# Patient Record
Sex: Female | Born: 1970 | Race: Black or African American | Hispanic: No | Marital: Married | Smoking: Never smoker
Health system: Southern US, Community
[De-identification: ages and names within clinical notes are randomized; demographics above are authoritative.]

## PROBLEM LIST (undated history)

## (undated) ENCOUNTER — Inpatient Hospital Stay (HOSPITAL_COMMUNITY): Payer: Self-pay

## (undated) DIAGNOSIS — K219 Gastro-esophageal reflux disease without esophagitis: Secondary | ICD-10-CM

## (undated) DIAGNOSIS — Z789 Other specified health status: Secondary | ICD-10-CM

## (undated) DIAGNOSIS — I10 Essential (primary) hypertension: Secondary | ICD-10-CM

## (undated) HISTORY — PX: TUBAL LIGATION: SHX77

## (undated) HISTORY — DX: Gastro-esophageal reflux disease without esophagitis: K21.9

## (undated) HISTORY — DX: Essential (primary) hypertension: I10

## (undated) HISTORY — PX: NO PAST SURGERIES: SHX2092

---

## 1999-10-03 ENCOUNTER — Other Ambulatory Visit: Admission: RE | Admit: 1999-10-03 | Discharge: 1999-10-03 | Payer: Self-pay | Admitting: Obstetrics

## 2001-05-14 ENCOUNTER — Other Ambulatory Visit: Admission: RE | Admit: 2001-05-14 | Discharge: 2001-05-14 | Payer: Self-pay | Admitting: Gynecology

## 2002-12-31 ENCOUNTER — Emergency Department (HOSPITAL_COMMUNITY): Admission: EM | Admit: 2002-12-31 | Discharge: 2002-12-31 | Payer: Self-pay | Admitting: Emergency Medicine

## 2006-01-01 ENCOUNTER — Other Ambulatory Visit: Admission: RE | Admit: 2006-01-01 | Discharge: 2006-01-01 | Payer: Self-pay | Admitting: Obstetrics and Gynecology

## 2014-08-12 ENCOUNTER — Other Ambulatory Visit: Payer: Self-pay

## 2014-09-10 ENCOUNTER — Other Ambulatory Visit (HOSPITAL_COMMUNITY): Payer: Self-pay | Admitting: Obstetrics and Gynecology

## 2014-09-10 DIAGNOSIS — IMO0002 Reserved for concepts with insufficient information to code with codable children: Secondary | ICD-10-CM

## 2014-09-10 DIAGNOSIS — Z0489 Encounter for examination and observation for other specified reasons: Secondary | ICD-10-CM

## 2014-09-16 ENCOUNTER — Ambulatory Visit (HOSPITAL_COMMUNITY)
Admission: RE | Admit: 2014-09-16 | Discharge: 2014-09-16 | Disposition: A | Discharge status: Home / Self Care | Payer: Federal, State, Local not specified - PPO | Source: Ambulatory Visit | Attending: Obstetrics and Gynecology | Admitting: Obstetrics and Gynecology

## 2014-09-16 ENCOUNTER — Encounter (HOSPITAL_COMMUNITY): Payer: Self-pay

## 2014-09-16 VITALS — BP 130/67 | HR 101 | Wt 242.2 lb

## 2014-09-16 DIAGNOSIS — Z1389 Encounter for screening for other disorder: Secondary | ICD-10-CM | POA: Diagnosis not present

## 2014-09-16 DIAGNOSIS — O09292 Supervision of pregnancy with other poor reproductive or obstetric history, second trimester: Secondary | ICD-10-CM

## 2014-09-16 DIAGNOSIS — Z363 Encounter for antenatal screening for malformations: Secondary | ICD-10-CM | POA: Insufficient documentation

## 2014-09-16 DIAGNOSIS — Z0489 Encounter for examination and observation for other specified reasons: Secondary | ICD-10-CM

## 2014-09-16 DIAGNOSIS — IMO0002 Reserved for concepts with insufficient information to code with codable children: Secondary | ICD-10-CM

## 2014-09-16 DIAGNOSIS — O09299 Supervision of pregnancy with other poor reproductive or obstetric history, unspecified trimester: Secondary | ICD-10-CM | POA: Insufficient documentation

## 2014-10-01 ENCOUNTER — Other Ambulatory Visit (HOSPITAL_COMMUNITY): Payer: Self-pay | Admitting: Obstetrics and Gynecology

## 2014-10-01 DIAGNOSIS — O99212 Obesity complicating pregnancy, second trimester: Secondary | ICD-10-CM

## 2014-10-01 DIAGNOSIS — O09512 Supervision of elderly primigravida, second trimester: Secondary | ICD-10-CM

## 2014-10-01 DIAGNOSIS — E282 Polycystic ovarian syndrome: Secondary | ICD-10-CM

## 2014-10-07 ENCOUNTER — Other Ambulatory Visit (HOSPITAL_COMMUNITY): Payer: Self-pay | Admitting: Obstetrics and Gynecology

## 2014-10-07 ENCOUNTER — Encounter (HOSPITAL_COMMUNITY): Payer: Self-pay

## 2014-10-07 ENCOUNTER — Inpatient Hospital Stay (HOSPITAL_COMMUNITY)
Admission: AD | Admit: 2014-10-07 | Discharge: 2014-10-13 | DRG: 782 | Disposition: A | Discharge status: Home / Self Care | Payer: Federal, State, Local not specified - PPO | Source: Ambulatory Visit | Attending: Obstetrics and Gynecology | Admitting: Obstetrics and Gynecology

## 2014-10-07 ENCOUNTER — Ambulatory Visit (HOSPITAL_COMMUNITY)
Admission: RE | Admit: 2014-10-07 | Discharge: 2014-10-07 | Disposition: A | Discharge status: Home / Self Care | Payer: Federal, State, Local not specified - PPO | Source: Ambulatory Visit | Attending: Obstetrics and Gynecology | Admitting: Obstetrics and Gynecology

## 2014-10-07 ENCOUNTER — Encounter (HOSPITAL_COMMUNITY): Payer: Self-pay | Admitting: *Deleted

## 2014-10-07 VITALS — BP 141/84 | HR 91 | Wt 248.0 lb

## 2014-10-07 DIAGNOSIS — O4102X0 Oligohydramnios, second trimester, not applicable or unspecified: Secondary | ICD-10-CM | POA: Diagnosis present

## 2014-10-07 DIAGNOSIS — O139 Gestational [pregnancy-induced] hypertension without significant proteinuria, unspecified trimester: Secondary | ICD-10-CM | POA: Diagnosis present

## 2014-10-07 DIAGNOSIS — O99212 Obesity complicating pregnancy, second trimester: Secondary | ICD-10-CM

## 2014-10-07 DIAGNOSIS — E282 Polycystic ovarian syndrome: Secondary | ICD-10-CM

## 2014-10-07 DIAGNOSIS — Z8249 Family history of ischemic heart disease and other diseases of the circulatory system: Secondary | ICD-10-CM

## 2014-10-07 DIAGNOSIS — IMO0002 Reserved for concepts with insufficient information to code with codable children: Secondary | ICD-10-CM

## 2014-10-07 DIAGNOSIS — O09512 Supervision of elderly primigravida, second trimester: Secondary | ICD-10-CM

## 2014-10-07 DIAGNOSIS — Z3A23 23 weeks gestation of pregnancy: Secondary | ICD-10-CM | POA: Diagnosis present

## 2014-10-07 DIAGNOSIS — Z3A24 24 weeks gestation of pregnancy: Secondary | ICD-10-CM

## 2014-10-07 DIAGNOSIS — O365920 Maternal care for other known or suspected poor fetal growth, second trimester, not applicable or unspecified: Secondary | ICD-10-CM | POA: Diagnosis present

## 2014-10-07 DIAGNOSIS — O132 Gestational [pregnancy-induced] hypertension without significant proteinuria, second trimester: Principal | ICD-10-CM | POA: Diagnosis present

## 2014-10-07 HISTORY — DX: Supervision of elderly primigravida, second trimester: O09.512

## 2014-10-07 HISTORY — DX: Other specified health status: Z78.9

## 2014-10-07 HISTORY — DX: Reserved for concepts with insufficient information to code with codable children: IMO0002

## 2014-10-07 HISTORY — DX: Gestational (pregnancy-induced) hypertension without significant proteinuria, unspecified trimester: O13.9

## 2014-10-07 LAB — URINALYSIS, ROUTINE W REFLEX MICROSCOPIC
Bilirubin Urine: NEGATIVE
Glucose, UA: NEGATIVE mg/dL
Hgb urine dipstick: NEGATIVE
Ketones, ur: NEGATIVE mg/dL
Leukocytes, UA: NEGATIVE
NITRITE: NEGATIVE
PH: 6 (ref 5.0–8.0)
Protein, ur: NEGATIVE mg/dL
Urobilinogen, UA: 0.2 mg/dL (ref 0.0–1.0)

## 2014-10-07 LAB — COMPREHENSIVE METABOLIC PANEL
ALT: 106 U/L — AB (ref 0–35)
AST: 47 U/L — AB (ref 0–37)
Albumin: 3.3 g/dL — ABNORMAL LOW (ref 3.5–5.2)
Alkaline Phosphatase: 70 U/L (ref 39–117)
Anion gap: 12 (ref 5–15)
BUN: 8 mg/dL (ref 6–23)
CALCIUM: 9.3 mg/dL (ref 8.4–10.5)
CO2: 20 mEq/L (ref 19–32)
Chloride: 104 mEq/L (ref 96–112)
Creatinine, Ser: 0.66 mg/dL (ref 0.50–1.10)
GFR calc Af Amer: >90 mL/min (ref >90)
GFR calc non Af Amer: >90 mL/min (ref >90)
Glucose, Bld: 91 mg/dL (ref 70–99)
Potassium: 4.6 mEq/L (ref 3.7–5.3)
SODIUM: 136 meq/L — AB (ref 137–147)
TOTAL PROTEIN: 6.7 g/dL (ref 6.0–8.3)
Total Bilirubin: 0.2 mg/dL — ABNORMAL LOW (ref 0.3–1.2)

## 2014-10-07 LAB — CBC
HCT: 39.3 % (ref 36.0–46.0)
Hemoglobin: 13.2 g/dL (ref 12.0–15.0)
MCH: 26.9 pg (ref 26.0–34.0)
MCHC: 33.6 g/dL (ref 30.0–36.0)
MCV: 80 fL (ref 78.0–100.0)
PLATELETS: 261 10*3/uL (ref 150–400)
RBC: 4.91 MIL/uL (ref 3.87–5.11)
RDW: 16 % — ABNORMAL HIGH (ref 11.5–15.5)
WBC: 7.5 10*3/uL (ref 4.0–10.5)

## 2014-10-07 LAB — TYPE AND SCREEN
ABO/RH(D): O POS
Antibody Screen: NEGATIVE

## 2014-10-07 LAB — URIC ACID: URIC ACID, SERUM: 4.7 mg/dL (ref 2.4–7.0)

## 2014-10-07 LAB — LACTATE DEHYDROGENASE: LDH: 209 U/L (ref 94–250)

## 2014-10-07 MED ORDER — PRENATAL MULTIVITAMIN CH
1.0000 | ORAL_TABLET | Freq: Every day | ORAL | Status: DC
  Administered 2014-10-08 – 2014-10-13 (×6): 1 via ORAL
  Filled 2014-10-07 (×8): qty 1

## 2014-10-07 MED ORDER — BETAMETHASONE SOD PHOS & ACET 6 (3-3) MG/ML IJ SUSP
12.0000 mg | INTRAMUSCULAR | Status: AC
  Administered 2014-10-07 – 2014-10-08 (×2): 12 mg via INTRAMUSCULAR
  Filled 2014-10-07 (×2): qty 2

## 2014-10-07 MED ORDER — SODIUM CHLORIDE 0.9 % IJ SOLN
3.0000 mL | Freq: Two times a day (BID) | INTRAMUSCULAR | Status: DC
  Administered 2014-10-07 – 2014-10-10 (×7): 3 mL via INTRAVENOUS

## 2014-10-07 MED ORDER — ACETAMINOPHEN 325 MG PO TABS: 650.0000 mg | ORAL_TABLET | ORAL | Status: DC | PRN

## 2014-10-07 MED ORDER — PRENATAL MULTIVITAMIN CH: 1.0000 | ORAL_TABLET | Freq: Every day | ORAL | Status: DC

## 2014-10-07 MED ORDER — CALCIUM CARBONATE ANTACID 500 MG PO CHEW
2.0000 | CHEWABLE_TABLET | ORAL | Status: DC | PRN
Start: 2014-10-07 — End: 2014-10-13
  Filled 2014-10-07: qty 2

## 2014-10-07 MED ORDER — DOCUSATE SODIUM 100 MG PO CAPS
100.0000 mg | ORAL_CAPSULE | Freq: Every day | ORAL | Status: DC
  Administered 2014-10-08 – 2014-10-13 (×6): 100 mg via ORAL
  Filled 2014-10-07 (×8): qty 1

## 2014-10-07 MED ORDER — ZOLPIDEM TARTRATE 5 MG PO TABS
5.0000 mg | ORAL_TABLET | Freq: Every evening | ORAL | Status: DC | PRN
Start: 2014-10-07 — End: 2014-10-13

## 2014-10-07 NOTE — MAU Note (Signed)
Pt sent from MFM for elevated B/ps. And low fluid.

## 2014-10-07 NOTE — MAU Provider Note (Signed)
History     CSN: [id number]  Arrival date and time: 10/07/14 1238   None     Chief Complaint  Patient presents with  . Hypertension   HPI Pt is 8082w2d pregnant sent from MFM with elevated BP and low fluid. Pt denies abd pain,nausea,  UTI sx or headache. RN note: Andrey CampanileWilson, RN Registered Nurse Signed  MAU Note Service date: 10/07/2014 12:58 PM   Pt sent from MFM for elevated B/ps. And low fluid      Past Medical History  Diagnosis Date  . Medical history non-contributory     Past Surgical History  Procedure Laterality Date  . No past surgeries      No family history on file.  History  Substance Use Topics  . Smoking status: Never Smoker   . Smokeless tobacco: Not on file  . Alcohol Use: No    Allergies: No Known Allergies  Prescriptions prior to admission  Medication Sig Dispense Refill  . Acetaminophen (TYLENOL PO) Take by mouth.      . Prenatal Vit-Fe Fumarate-FA (PRENATAL VITAMIN PO) Take by mouth.        Review of Systems  Constitutional: Negative for fever and chills.  Gastrointestinal: Negative for nausea, vomiting, abdominal pain and diarrhea.  Genitourinary: Negative for dysuria.   Physical Exam   There were no vitals taken for this visit.  Physical Exam  Nursing note and vitals reviewed. Constitutional: She is oriented to person, place, and time. She appears well-developed and well-nourished. No distress.  HENT:  Head: Normocephalic.  Eyes: Pupils are equal, round, and reactive to light.  Neck: Normal range of motion.  Cardiovascular: Normal rate.   Respiratory: Effort normal.  GI: Soft. She exhibits no distension. There is no tenderness.  FHR 160 baseline No ctx noted  Musculoskeletal: Normal range of motion.  Neurological: She is alert and oriented to person, place, and time.  Skin: Skin is warm and dry.    MAU Course  Procedures Results for orders placed during the hospital encounter of 10/07/14 (from the past 24 hour(s))   URINALYSIS, ROUTINE W REFLEX MICROSCOPIC     Status: Abnormal   Collection Time    10/07/14 12:55 PM      Result Value Ref Range   Color, Urine YELLOW  YELLOW   APPearance CLEAR  CLEAR   Specific Gravity, Urine <1.005 (*) 1.005 - 1.030   pH 6.0  5.0 - 8.0   Glucose, UA NEGATIVE  NEGATIVE mg/dL   Hgb urine dipstick NEGATIVE  NEGATIVE   Bilirubin Urine NEGATIVE  NEGATIVE   Ketones, ur NEGATIVE  NEGATIVE mg/dL   Protein, ur NEGATIVE  NEGATIVE mg/dL   Urobilinogen, UA 0.2  0.0 - 1.0 mg/dL   Nitrite NEGATIVE  NEGATIVE   Leukocytes, UA NEGATIVE  NEGATIVE  CBC     Status: Abnormal   Collection Time    10/07/14  1:03 PM      Result Value Ref Range   WBC 7.5  4.0 - 10.5 K/uL   RBC 4.91  3.87 - 5.11 MIL/uL   Hemoglobin 13.2  12.0 - 15.0 g/dL   HCT 32.439.3  40.136.0 - 02.746.0 %   MCV 80.0  78.0 - 100.0 fL   MCH 26.9  26.0 - 34.0 pg   MCHC 33.6  30.0 - 36.0 g/dL   RDW 25.316.0 (*) 66.411.5 - 40.315.5 %   Platelets 261  150 - 400 K/uL  COMPREHENSIVE METABOLIC PANEL     Status: Abnormal  Collection Time    10/07/14  1:03 PM      Result Value Ref Range   Sodium 136 (*) 137 - 147 mEq/L   Potassium 4.6  3.7 - 5.3 mEq/L   Chloride 104  96 - 112 mEq/L   CO2 20  19 - 32 mEq/L   Glucose, Bld 91  70 - 99 mg/dL   BUN 8  6 - 23 mg/dL   Creatinine, Ser 1.47  0.50 - 1.10 mg/dL   Calcium 9.3  8.4 - 82.9 mg/dL   Total Protein 6.7  6.0 - 8.3 g/dL   Albumin 3.3 (*) 3.5 - 5.2 g/dL   AST 47 (*) 0 - 37 U/L   ALT 106 (*) 0 - 35 U/L   Alkaline Phosphatase 70  39 - 117 U/L   Total Bilirubin 0.2 (*) 0.3 - 1.2 mg/dL   GFR calc non Af Amer >90  >90 mL/min   GFR calc Af Amer >90  >90 mL/min   Anion gap 12  5 - 15  LACTATE DEHYDROGENASE     Status: None   Collection Time    10/07/14  1:03 PM      Result Value Ref Range   LDH 209  94 - 250 U/L  URIC ACID     Status: None   Collection Time    10/07/14  1:03 PM      Result Value Ref Range   Uric Acid, Serum 4.7  2.4 - 7.0 mg/dL  discussed with Dr. Arelia Sneddon- will admit  due to elevated LFT Discussed with pt and husband  Assessment and Plan  Hypertension in pregnancy- will admit to antenatal per Dr. Gabriel Cirri 10/07/2014, 12:59 PM

## 2014-10-07 NOTE — H&P (Signed)
Zenaida DeedLakiesha Fleek is a 44 y.o. female presenting 23.3 weeks with gestational hypertension.  Seen by MFM today for sonogram.  Blood pressure noted to be elevated.  Sonogram with subjectely low amniotic fluid.  Estimated fetal weight 17%tile and abdominal circumference 3%tile.  In MAU LFT'S were elevated admitted for evaluation.  Maternal Medical History:  Reason for admission: Gestational hypertension with elevated LFT'S  Fetal activity: Perceived fetal activity is normal.    Prenatal complications: IUGR, oligohydramnios and pre-eclampsia.     OB History   Grav Para Term Preterm Abortions TAB SAB Ect Mult Living   1              Past Medical History  Diagnosis Date  . Medical history non-contributory    Past Surgical History  Procedure Laterality Date  . No past surgeries     Family History: family history is not on file. Social History:  reports that she has never smoked. She does not have any smokeless tobacco history on file. She reports that she does not drink alcohol or use illicit drugs.   Prenatal Transfer Tool  Maternal Diabetes: not tested yet Genetic Screening: Normal Maternal Ultrasounds/Referrals: Abnormal:  Findings:   IUGR Fetal Ultrasounds or other Referrals:  None Maternal Substance Abuse:  No Significant Maternal Medications:  None Significant Maternal Lab Results:  None Other Comments:  None  ROS    Blood pressure 144/84, pulse 82. Exam Physical Exam  Constitutional: She is oriented to person, place, and time. She appears well-developed and well-nourished.  HENT:  Head: Normocephalic.  Eyes: Conjunctivae and EOM are normal. Pupils are equal, round, and reactive to light.  Cardiovascular: Normal rate, regular rhythm and normal heart sounds.   Respiratory: Effort normal and breath sounds normal.  GI:  GRAVID UTERUS  Musculoskeletal: She exhibits edema.  Neurological: She is alert and oriented to person, place, and time. She has normal reflexes.     Prenatal labs: ABO, Rh:   Antibody:   Rubella:   RPR:    HBsAg:    HIV:    GBS:     Assessment/Plan: IUP at 23.3 weeks with gestational hypertension with IUGR and elevated LFT'S ADMITT. Bedrest.   Steriods. Repeat labs in the am   [facility] CenterMCCOMB,Rylend Pietrzak S 10/07/2014, 2:37 PM

## 2014-10-08 ENCOUNTER — Inpatient Hospital Stay (HOSPITAL_COMMUNITY): Payer: Federal, State, Local not specified - PPO

## 2014-10-08 LAB — COMPREHENSIVE METABOLIC PANEL WITH GFR
ALT: 87 U/L — ABNORMAL HIGH (ref 0–35)
AST: 35 U/L (ref 0–37)
Albumin: 3 g/dL — ABNORMAL LOW (ref 3.5–5.2)
Alkaline Phosphatase: 61 U/L (ref 39–117)
Anion gap: 10 (ref 5–15)
BUN: 9 mg/dL (ref 6–23)
CO2: 20 meq/L (ref 19–32)
Calcium: 8.7 mg/dL (ref 8.4–10.5)
Chloride: 104 meq/L (ref 96–112)
Creatinine, Ser: 0.59 mg/dL (ref 0.50–1.10)
GFR calc Af Amer: >90 mL/min
GFR calc non Af Amer: >90 mL/min
Glucose, Bld: 123 mg/dL — ABNORMAL HIGH (ref 70–99)
Potassium: 4.5 meq/L (ref 3.7–5.3)
Sodium: 134 meq/L — ABNORMAL LOW (ref 137–147)
Total Bilirubin: <0.2 mg/dL — ABNORMAL LOW (ref 0.3–1.2)
Total Protein: 6.6 g/dL (ref 6.0–8.3)

## 2014-10-08 LAB — CBC
HCT: 37.7 % (ref 36.0–46.0)
Hemoglobin: 12.7 g/dL (ref 12.0–15.0)
MCH: 26.9 pg (ref 26.0–34.0)
MCHC: 33.7 g/dL (ref 30.0–36.0)
MCV: 79.9 fL (ref 78.0–100.0)
Platelets: 253 10*3/uL (ref 150–400)
RBC: 4.72 MIL/uL (ref 3.87–5.11)
RDW: 15.7 % — ABNORMAL HIGH (ref 11.5–15.5)
WBC: 13 10*3/uL — ABNORMAL HIGH (ref 4.0–10.5)

## 2014-10-08 LAB — ABO/RH: ABO/RH(D): O POS

## 2014-10-08 MED ORDER — LABETALOL HCL 100 MG PO TABS
200.0000 mg | ORAL_TABLET | Freq: Two times a day (BID) | ORAL | Status: DC
  Administered 2014-10-08 – 2014-10-13 (×11): 200 mg via ORAL
  Filled 2014-10-08 (×12): qty 2

## 2014-10-08 NOTE — Consult Note (Signed)
Maternal Fetal Medicine Consultation  Requesting Provider(s): Marylynn Pearson, MD  Reason for consultation: CHTN, elevated ALT, early fetal growth restriction  HPI: Bethany Rojas is a 44 year-old G1P0 currently at 23w 3d admitted due to chronic hypertension, elevated ALT and fetal growth restriction.  The patient was seen for ultrasound by MFM yesterday - noted to have an estimated fetal weight at the 17th %tile and a lagging AC at the 4th %tile.  The amniotic fluid volume was subjectively decreased but with a normal maximum vertical pocket.  At the time of her evaluation, she was noted to have some elevated blood pressures and was sent to MAU for further evaluation.  Her preeclampsia labs were within normal limits with the exception of an ALT of 106.  She is currently admitted for a course of betamethasone and 24-hr urine collection.  The patient denies a history of chronic hypertension prior to pregnancy.  She is without complaints - denies HA, visual changes or RUQ pain.  The fetus is active.  OB History: OB History   Grav Para Term Preterm Abortions TAB SAB Ect Mult Living   1               PMH:  Past Medical History  Diagnosis Date  . Medical history non-contributory     PSH:  Past Surgical History  Procedure Laterality Date  . No past surgeries      Meds:  Scheduled Meds: . betamethasone acetate-betamethasone sodium phosphate  12 mg Intramuscular Q24H  . docusate sodium  100 mg Oral Daily  . prenatal multivitamin  1 tablet Oral Q1200  . sodium chloride  3 mL Intravenous Q12H   Continuous Infusions:  PRN Meds:.acetaminophen, calcium carbonate, zolpidem Scheduled Meds:  Allergies: No Known Allergies  FH:  Family History  Problem Relation Age of Onset  . Cancer Mother   . Hypertension Mother    Soc:  History   Social History  . Marital Status: Married    Spouse Name: N/A    Number of Children: N/A  . Years of Education: N/A   Occupational History  . Not on  file.   Social History Main Topics  . Smoking status: Never Smoker   . Smokeless tobacco: Not on file  . Alcohol Use: No  . Drug Use: No  . Sexual Activity: Yes    Birth Control/ Protection: Surgical   Other Topics Concern  . Not on file   Social History Narrative  . No narrative on file    Review of Systems: no vaginal bleeding or cramping/contractions, no LOF, no nausea/vomiting. All other systems reviewed and are negative.  PE:   Filed Vitals:   10/08/14 0800  BP: 130/95  Pulse: 85  Temp: 98 F (36.7 C)  Resp: 18  BPs: 130/95, 111/52, 150/88, 148/85, 156/108   Labs:  Recent Results (from the past 2160 hour(s))  URINALYSIS, ROUTINE W REFLEX MICROSCOPIC     Status: Abnormal   Collection Time    10/07/14 12:55 PM      Result Value Ref Range   Color, Urine YELLOW  YELLOW   APPearance CLEAR  CLEAR   Specific Gravity, Urine <1.005 (*) 1.005 - 1.030   pH 6.0  5.0 - 8.0   Glucose, UA NEGATIVE  NEGATIVE mg/dL   Hgb urine dipstick NEGATIVE  NEGATIVE   Bilirubin Urine NEGATIVE  NEGATIVE   Ketones, ur NEGATIVE  NEGATIVE mg/dL   Protein, ur NEGATIVE  NEGATIVE mg/dL   Urobilinogen, UA 0.2  0.0 - 1.0 mg/dL   Nitrite NEGATIVE  NEGATIVE   Leukocytes, UA NEGATIVE  NEGATIVE   Comment: MICROSCOPIC NOT DONE ON URINES WITH NEGATIVE PROTEIN, BLOOD, LEUKOCYTES, NITRITE, OR GLUCOSE <1000 mg/dL.  CBC     Status: Abnormal   Collection Time    10/07/14  1:03 PM      Result Value Ref Range   WBC 7.5  4.0 - 10.5 K/uL   RBC 4.91  3.87 - 5.11 MIL/uL   Hemoglobin 13.2  12.0 - 15.0 g/dL   HCT 39.3  36.0 - 46.0 %   MCV 80.0  78.0 - 100.0 fL   MCH 26.9  26.0 - 34.0 pg   MCHC 33.6  30.0 - 36.0 g/dL   RDW 16.0 (*) 11.5 - 15.5 %   Platelets 261  150 - 400 K/uL  COMPREHENSIVE METABOLIC PANEL     Status: Abnormal   Collection Time    10/07/14  1:03 PM      Result Value Ref Range   Sodium 136 (*) 137 - 147 mEq/L   Potassium 4.6  3.7 - 5.3 mEq/L   Chloride 104  96 - 112 mEq/L   CO2 20   19 - 32 mEq/L   Glucose, Bld 91  70 - 99 mg/dL   BUN 8  6 - 23 mg/dL   Creatinine, Ser 0.66  0.50 - 1.10 mg/dL   Calcium 9.3  8.4 - 10.5 mg/dL   Total Protein 6.7  6.0 - 8.3 g/dL   Albumin 3.3 (*) 3.5 - 5.2 g/dL   AST 47 (*) 0 - 37 U/L   ALT 106 (*) 0 - 35 U/L   Alkaline Phosphatase 70  39 - 117 U/L   Total Bilirubin 0.2 (*) 0.3 - 1.2 mg/dL   GFR calc non Af Amer >90  >90 mL/min   GFR calc Af Amer >90  >90 mL/min   Comment: (NOTE)     The eGFR has been calculated using the CKD EPI equation.     This calculation has not been validated in all clinical situations.     eGFR's persistently <90 mL/min signify possible Chronic Kidney     Disease.   Anion gap 12  5 - 15  LACTATE DEHYDROGENASE     Status: None   Collection Time    10/07/14  1:03 PM      Result Value Ref Range   LDH 209  94 - 250 U/L  URIC ACID     Status: None   Collection Time    10/07/14  1:03 PM      Result Value Ref Range   Uric Acid, Serum 4.7  2.4 - 7.0 mg/dL  TYPE AND SCREEN     Status: None   Collection Time    10/07/14  8:55 PM      Result Value Ref Range   ABO/RH(D) O POS     Antibody Screen NEG     Sample Expiration 10/10/2014    ABO/RH     Status: None   Collection Time    10/07/14  8:55 PM      Result Value Ref Range   ABO/RH(D) O POS    COMPREHENSIVE METABOLIC PANEL     Status: Abnormal   Collection Time    10/08/14  6:50 AM      Result Value Ref Range   Sodium 134 (*) 137 - 147 mEq/L   Potassium 4.5  3.7 - 5.3 mEq/L   Chloride  104  96 - 112 mEq/L   CO2 20  19 - 32 mEq/L   Glucose, Bld 123 (*) 70 - 99 mg/dL   BUN 9  6 - 23 mg/dL   Creatinine, Ser 0.59  0.50 - 1.10 mg/dL   Calcium 8.7  8.4 - 10.5 mg/dL   Total Protein 6.6  6.0 - 8.3 g/dL   Albumin 3.0 (*) 3.5 - 5.2 g/dL   AST 35  0 - 37 U/L   ALT 87 (*) 0 - 35 U/L   Alkaline Phosphatase 61  39 - 117 U/L   Total Bilirubin <0.2 (*) 0.3 - 1.2 mg/dL   GFR calc non Af Amer >90  >90 mL/min   GFR calc Af Amer >90  >90 mL/min   Comment: (NOTE)      The eGFR has been calculated using the CKD EPI equation.     This calculation has not been validated in all clinical situations.     eGFR's persistently <90 mL/min signify possible Chronic Kidney     Disease.   Anion gap 10  5 - 15  CBC     Status: Abnormal   Collection Time    10/08/14  6:50 AM      Result Value Ref Range   WBC 13.0 (*) 4.0 - 10.5 K/uL   RBC 4.72  3.87 - 5.11 MIL/uL   Hemoglobin 12.7  12.0 - 15.0 g/dL   HCT 37.7  36.0 - 46.0 %   MCV 79.9  78.0 - 100.0 fL   MCH 26.9  26.0 - 34.0 pg   MCHC 33.7  30.0 - 36.0 g/dL   RDW 15.7 (*) 11.5 - 15.5 %   Platelets 253  150 - 400 K/uL     A/P: 1) Single IUP at 23w 3d         2) Likely chronic hypertension vs. Gestational hypertension - await results of 24 hr urine protein (pending).           3) Fetal growth restriction and subjectively decreased amniotic fluid volume         4) Elevated ALT - with some improvement over the last 24 hrs.  Recommendations: 1) Concur with course of betamethasone and admission 2) At this point, difficult to determine if the ALT is an isolated, spurious value or if this is more consistent with preeclampsia.  Would await the results of the 24 hour urine protein and repeat LFTs tomorrow AM.  If the ALT continues to trend downward and the patient otherwise remains stable, feel that she may be discharged with close outpatient follow up.  If the patient is felt to be stable for discharge, would recommend weekly preeclampsia labs over the next several weeks. 3) Would recommend starting antihypertension medication - either Labetalol 200 mg BID or Procardia XL 30 mg daily 3) Would recommend weekly follow up with MFM for UA Doppler studies and AFIs.  Will need a follow up ultrasound in 3 weeks for interval growth.   4) Start weekly BPPs at 28 weeks  Thank you for the opportunity to be a part of the care of Bethany Rojas. Please contact our office if we can be of further assistance.   I spent  approximately 30 minutes with this patient with over 50% of time spent in face-to-face counseling.  Benjaman Lobe, MD Maternal Fetal Medicine

## 2014-10-08 NOTE — Progress Notes (Signed)
Admission nutrition screen triggered for unintentional weight loss > 10 lbs within the last month. . Patients chart reviewed and assessed  for nutritional risk. Per PNR weight is up 6 lbs in the past month. Patient is determined to be at low nutrition  risk.   Elisabeth CaraKatherine Fia Hebert M.Odis LusterEd. R.D. LDN Neonatal Nutrition Support Specialist/RD III Pager [phone]

## 2014-10-08 NOTE — Progress Notes (Signed)
Pt without c/o this am  AF, VSS  + FHT BP 130-140/80-90s Gen - NAD Abd - gravid, NT Ext - NT PV - deferred  A/P:  23+ 3 weeks with elevated BP and elevated LFT S/p BMZ x 1 LFTs with mild improvement today Hospital bedrest MFM to comanage

## 2014-10-08 NOTE — Plan of Care (Signed)
Problem: Consults Goal: Birthing Suites Patient Information Press F2 to bring up selections list  Outcome: Not Applicable Date Met:  10/08/14  Pt < [redacted] weeks EGA     

## 2014-10-08 NOTE — Plan of Care (Signed)
Problem: Consults Goal: Nutrition Consult-if indicated Outcome: Progressing Dietician in to see pt  Problem: Phase I Progression Outcomes Goal: No significant worsening bleeding/cervix change/vag drainage No significant worsening in vaginal bleeding, cervical change, or vaginal drainage.  Outcome: Progressing Pt denies any VB, or vaginal discharge Goal: LOS < 4 days Outcome: Progressing Possible outpatient management if patient responds well to the BP meds Goal: Contractions < 5-6/hour Outcome: Progressing No contractions noted Goal: Maintains reassuring Fetal Heart Rate Outcome: Progressing Reassuring FHR on monitor Goal: Pain controlled with appropriate interventions Outcome: Not Applicable Date Met:  02/58/52 NO complaints of pain Goal: OOB as tolerated unless otherwise ordered Outcome: Progressing Pt with BRP  Goal: Hemodynamically stable Outcome: Progressing Labs being monitored closely due to increased liver enzymes  Problem: Phase II Progression Outcomes Goal: Electronic fetal monitoring(Doppler,Continuous,Intermittent) EFM (Doppler, Continuous, Intermittent)  Outcome: Progressing Fetal tracings reassurring Goal: Labs/tests as ordered Labs/tests as ordered (Magnesium level, CBG's, CBC, CMET, 24 hr Urine, Amniocentesis, Ultrasound, Other)  Outcome: Progressing CBC and CMET closely monitored Goal: Medications as ordered (see description) Medications as ordered (Magnesium Sulfate, Steroids, PO Tocolysis, Antibiotics, Terbutaline Pump, Other)  Outcome: Progressing Labetalol initiated 10/09

## 2014-10-09 LAB — COMPREHENSIVE METABOLIC PANEL
ALBUMIN: 3 g/dL — AB (ref 3.5–5.2)
ALT: 79 U/L — ABNORMAL HIGH (ref 0–35)
AST: 32 U/L (ref 0–37)
Alkaline Phosphatase: 58 U/L (ref 39–117)
Anion gap: 12 (ref 5–15)
BUN: 8 mg/dL (ref 6–23)
CALCIUM: 8.8 mg/dL (ref 8.4–10.5)
CO2: 20 meq/L (ref 19–32)
CREATININE: 0.62 mg/dL (ref 0.50–1.10)
Chloride: 104 mEq/L (ref 96–112)
GFR calc Af Amer: >90 mL/min (ref >90)
Glucose, Bld: 120 mg/dL — ABNORMAL HIGH (ref 70–99)
Potassium: 4.5 mEq/L (ref 3.7–5.3)
SODIUM: 136 meq/L — AB (ref 137–147)
TOTAL PROTEIN: 6.3 g/dL (ref 6.0–8.3)
Total Bilirubin: <0.2 mg/dL — ABNORMAL LOW (ref 0.3–1.2)

## 2014-10-09 LAB — CREATININE CLEARANCE, URINE, 24 HOUR
COLLECTION INTERVAL-CRCL: 24 h
CREATININE, URINE: 39.75 mg/dL
Creatinine Clearance: 197 mL/min — ABNORMAL HIGH (ref 75–115)
Creatinine, 24H Ur: 1670 mg/d (ref 700–1800)
Creatinine: 0.59 mg/dL (ref 0.50–1.10)
Urine Total Volume-CRCL: 4200 mL

## 2014-10-09 LAB — PROTEIN, URINE, 24 HOUR
Collection Interval-UPROT: 24 hours
PROTEIN, URINE: 4 mg/dL — AB (ref 5–24)
Protein, 24H Urine: 168 mg/d — ABNORMAL HIGH (ref <150)
Urine Total Volume-UPROT: 4200 mL

## 2014-10-09 LAB — CBC
HCT: 37.1 % (ref 36.0–46.0)
Hemoglobin: 12.3 g/dL (ref 12.0–15.0)
MCH: 26.9 pg (ref 26.0–34.0)
MCHC: 33.2 g/dL (ref 30.0–36.0)
MCV: 81 fL (ref 78.0–100.0)
Platelets: 262 10*3/uL (ref 150–400)
RBC: 4.58 MIL/uL (ref 3.87–5.11)
RDW: 16.2 % — AB (ref 11.5–15.5)
WBC: 14.3 10*3/uL — ABNORMAL HIGH (ref 4.0–10.5)

## 2014-10-09 NOTE — Plan of Care (Signed)
Problem: Phase I Progression Outcomes Goal: Maintains reassuring Fetal Heart Rate Outcome: Progressing Reassuring fetal heart rate  Problem: Phase II Progression Outcomes Goal: Electronic fetal monitoring(Doppler,Continuous,Intermittent) EFM (Doppler, Continuous, Intermittent)  Outcome: Progressing Reassuring NST Goal: Output > 30 ml/hr or voiding qs Outcome: Progressing Adequate output during shift. Goal: Labs/tests as ordered Labs/tests as ordered (Magnesium level, CBG's, CBC, CMET, 24 hr Urine, Amniocentesis, Ultrasound, Other)  Outcome: Progressing Discussed that labs will be taken daily. Goal: Medications as ordered (see description) Medications as ordered (Magnesium Sulfate, Steroids, PO Tocolysis, Antibiotics, Terbutaline Pump, Other)  Outcome: Progressing Labetalol as ordered.

## 2014-10-09 NOTE — Progress Notes (Signed)
Pt denies HA, n/v, and visual changes.  + FM.  No ctx, vb or lof  AF, VSS BP 120-130/60-70s Gen - NAD Abd - gravid, NT Ext - NT  Labs reviewed - LFTs improved but not wnl 24 hr urine 168mg  protein  A/P:  23+4 weeks with CHTN. S/p BMZ BP improved with increased labetalol Continue to follow LFTs - if normalize and BP stable will consider outpt mngt

## 2014-10-10 LAB — CBC
HCT: 36.2 % (ref 36.0–46.0)
HEMOGLOBIN: 12.1 g/dL (ref 12.0–15.0)
MCH: 26.9 pg (ref 26.0–34.0)
MCHC: 33.4 g/dL (ref 30.0–36.0)
MCV: 80.6 fL (ref 78.0–100.0)
PLATELETS: 251 10*3/uL (ref 150–400)
RBC: 4.49 MIL/uL (ref 3.87–5.11)
RDW: 16.4 % — ABNORMAL HIGH (ref 11.5–15.5)
WBC: 11.3 10*3/uL — AB (ref 4.0–10.5)

## 2014-10-10 LAB — COMPREHENSIVE METABOLIC PANEL
ALT: 58 U/L — AB (ref 0–35)
AST: 18 U/L (ref 0–37)
Albumin: 2.9 g/dL — ABNORMAL LOW (ref 3.5–5.2)
Alkaline Phosphatase: 59 U/L (ref 39–117)
Anion gap: 11 (ref 5–15)
BUN: 10 mg/dL (ref 6–23)
CO2: 21 meq/L (ref 19–32)
CREATININE: 0.65 mg/dL (ref 0.50–1.10)
Calcium: 8.6 mg/dL (ref 8.4–10.5)
Chloride: 105 mEq/L (ref 96–112)
GFR calc Af Amer: >90 mL/min (ref >90)
Glucose, Bld: 91 mg/dL (ref 70–99)
Potassium: 4.3 mEq/L (ref 3.7–5.3)
Sodium: 137 mEq/L (ref 137–147)
Total Protein: 6.1 g/dL (ref 6.0–8.3)

## 2014-10-10 NOTE — Plan of Care (Signed)
Problem: Phase II Progression Outcomes Goal: Electronic fetal monitoring(Doppler,Continuous,Intermittent) EFM (Doppler, Continuous, Intermittent)  Outcome: Progressing Reassuring NST. Goal: Tolerating diet Outcome: Progressing Pt tolerating regular diet. Goal: Progress activity as tolerated unless otherwise ordered Outcome: Progressing Pt has bathroom privileges. Goal: Output > 30 ml/hr or voiding qs Outcome: Progressing Adequate output during shift. Goal: Medications as ordered (see description) Medications as ordered (Magnesium Sulfate, Steroids, PO Tocolysis, Antibiotics, Terbutaline Pump, Other)  Outcome: Progressing Labetalol given as order during shift.

## 2014-10-10 NOTE — Progress Notes (Signed)
Pt denies HA, n/v, and visual changes. + FM. No ctx, vb or lof   AF, VSS BP  BP 120-130/60-70s  Gen - NAD  Abd - gravid, NT  Ext - NT   Labs pending today  24 hr urine 168mg  protein   A/P: 23+4 weeks with CHTN.  S/p BMZ  BP improved with increased labetalol  Continue to follow LFTs (am labs pending)- if normalize and BP stable will consider outpt mngt

## 2014-10-11 LAB — COMPREHENSIVE METABOLIC PANEL
ALBUMIN: 2.8 g/dL — AB (ref 3.5–5.2)
ALT: 49 U/L — ABNORMAL HIGH (ref 0–35)
AST: 22 U/L (ref 0–37)
Alkaline Phosphatase: 58 U/L (ref 39–117)
Anion gap: 13 (ref 5–15)
BUN: 8 mg/dL (ref 6–23)
CO2: 20 mEq/L (ref 19–32)
CREATININE: 0.64 mg/dL (ref 0.50–1.10)
Calcium: 8.8 mg/dL (ref 8.4–10.5)
Chloride: 104 mEq/L (ref 96–112)
GFR calc Af Amer: >90 mL/min (ref >90)
GFR calc non Af Amer: >90 mL/min (ref >90)
Glucose, Bld: 116 mg/dL — ABNORMAL HIGH (ref 70–99)
Potassium: 4.1 mEq/L (ref 3.7–5.3)
Sodium: 137 mEq/L (ref 137–147)
TOTAL PROTEIN: 6.3 g/dL (ref 6.0–8.3)
Total Bilirubin: 0.2 mg/dL — ABNORMAL LOW (ref 0.3–1.2)

## 2014-10-11 LAB — CBC
HCT: 37.2 % (ref 36.0–46.0)
Hemoglobin: 12.4 g/dL (ref 12.0–15.0)
MCH: 26.7 pg (ref 26.0–34.0)
MCHC: 33.3 g/dL (ref 30.0–36.0)
MCV: 80 fL (ref 78.0–100.0)
PLATELETS: 292 10*3/uL (ref 150–400)
RBC: 4.65 MIL/uL (ref 3.87–5.11)
RDW: 16.9 % — AB (ref 11.5–15.5)
WBC: 10.9 10*3/uL — AB (ref 4.0–10.5)

## 2014-10-11 NOTE — Progress Notes (Signed)
I made an initial visit with Bethany Rojas.  She reported that she had no particular needs or concerns at this time. She is aware of our on-going availability should needs arise.  [id number] Jackson DriveChaplain Katy Albinlaussen Pager, [phone] 3:40 PM   10/11/14 1500  Clinical Encounter Type  Visited With Patient  Visit Type Initial  Referral From Nurse

## 2014-10-11 NOTE — Plan of Care (Signed)
Problem: Consults Goal: Chaplain Consult Outcome: Progressing K. Claussen made rounds on patient. Patient informed to notify for any needs that may arise. Patient receptive.  Goal: Teacher, musicVolunteer Services (Diversional Activities, Age Appropriate) Outcome: Progressing Informed patient of volunteer services that are available to antenatal patients. Patient receptive.   Problem: Phase I Progression Outcomes Goal: LOS < 4 days Outcome: Not Progressing Patient here for prolonged hospitalization.   Problem: Phase II Progression Outcomes Goal: Electronic fetal monitoring(Doppler,Continuous,Intermittent) EFM (Doppler, Continuous, Intermittent)  Outcome: Progressing Patient on fetal monitoring 30 minutes every shift.

## 2014-10-11 NOTE — Plan of Care (Signed)
Problem: Phase I Progression Outcomes Goal: Other Phase I Outcomes/Goals Outcome: Progressing Instructed patient on pre-eclampsia, signs of worsening, and signs to report to nurse. Patient receptive.

## 2014-10-11 NOTE — Progress Notes (Signed)
23 6/7 wks  No HA, no vision change, no epigastric pain  BPs 108-122/56-60 Lungs CTA Abd no epigastric tenderness DTR 2+   A/P: Prob CHTN with mild preeclampsia with proteinuria ( 168 mg/24 hrs) and elevated ALT         ALT decreasing-today's labs pending         Will check labs and consider outpt management if ALT normal         S/P BMZ

## 2014-10-12 ENCOUNTER — Inpatient Hospital Stay (HOSPITAL_COMMUNITY): Payer: Federal, State, Local not specified - PPO

## 2014-10-12 LAB — CBC
HCT: 36.9 % (ref 36.0–46.0)
HEMOGLOBIN: 12.4 g/dL (ref 12.0–15.0)
MCH: 27 pg (ref 26.0–34.0)
MCHC: 33.6 g/dL (ref 30.0–36.0)
MCV: 80.2 fL (ref 78.0–100.0)
PLATELETS: 254 10*3/uL (ref 150–400)
RBC: 4.6 MIL/uL (ref 3.87–5.11)
RDW: 16.4 % — ABNORMAL HIGH (ref 11.5–15.5)
WBC: 9.2 10*3/uL (ref 4.0–10.5)

## 2014-10-12 LAB — TYPE AND SCREEN
ABO/RH(D): O POS
Antibody Screen: NEGATIVE

## 2014-10-12 LAB — COMPREHENSIVE METABOLIC PANEL
ALT: 70 U/L — ABNORMAL HIGH (ref 0–35)
AST: 43 U/L — AB (ref 0–37)
Albumin: 2.8 g/dL — ABNORMAL LOW (ref 3.5–5.2)
Alkaline Phosphatase: 61 U/L (ref 39–117)
Anion gap: 12 (ref 5–15)
BUN: 10 mg/dL (ref 6–23)
CHLORIDE: 104 meq/L (ref 96–112)
CO2: 20 meq/L (ref 19–32)
Calcium: 8.8 mg/dL (ref 8.4–10.5)
Creatinine, Ser: 0.66 mg/dL (ref 0.50–1.10)
GFR calc Af Amer: >90 mL/min (ref >90)
Glucose, Bld: 93 mg/dL (ref 70–99)
Potassium: 4.5 mEq/L (ref 3.7–5.3)
Sodium: 136 mEq/L — ABNORMAL LOW (ref 137–147)
Total Protein: 6 g/dL (ref 6.0–8.3)

## 2014-10-12 NOTE — Plan of Care (Signed)
Problem: Consults Goal: Chaplain Consult Outcome: Progressing Pt states that she saw the chaplain yesterday  Problem: Phase I Progression Outcomes Goal: Bowel movement every 2 days Outcome: Progressing Every 2 days per pt

## 2014-10-12 NOTE — Progress Notes (Addendum)
Patient ID: Bethany Rojas, female   DOB: 1970/12/08, 44 y.o.   MRN: [id number] Pt without  Complaints GFM  No HA, scotomata  VS  134/62 BP FHR 160s  Abd Gravid nt DTRS 1/4  Labs  ALT/AST both went up today  IUP at 24 weeks CHTN v mild preelampsia - previously falling, now increasing LFTs with stable BP Recheck in AM IUGR and Decreased AFI Recheck US in AM  DL

## 2014-10-13 ENCOUNTER — Ambulatory Visit (HOSPITAL_COMMUNITY): Payer: Federal, State, Local not specified - PPO

## 2014-10-13 LAB — CBC
HCT: 37.5 % (ref 36.0–46.0)
Hemoglobin: 12.5 g/dL (ref 12.0–15.0)
MCH: 26.9 pg (ref 26.0–34.0)
MCHC: 33.3 g/dL (ref 30.0–36.0)
MCV: 80.6 fL (ref 78.0–100.0)
PLATELETS: 257 10*3/uL (ref 150–400)
RBC: 4.65 MIL/uL (ref 3.87–5.11)
RDW: 16.5 % — ABNORMAL HIGH (ref 11.5–15.5)
WBC: 9 10*3/uL (ref 4.0–10.5)

## 2014-10-13 LAB — COMPREHENSIVE METABOLIC PANEL
ALT: 57 U/L — ABNORMAL HIGH (ref 0–35)
ANION GAP: 12 (ref 5–15)
AST: 26 U/L (ref 0–37)
Albumin: 2.8 g/dL — ABNORMAL LOW (ref 3.5–5.2)
Alkaline Phosphatase: 60 U/L (ref 39–117)
BUN: 10 mg/dL (ref 6–23)
CALCIUM: 8.7 mg/dL (ref 8.4–10.5)
CO2: 20 meq/L (ref 19–32)
CREATININE: 0.66 mg/dL (ref 0.50–1.10)
Chloride: 104 mEq/L (ref 96–112)
GFR calc Af Amer: >90 mL/min (ref >90)
GFR calc non Af Amer: >90 mL/min (ref >90)
Glucose, Bld: 93 mg/dL (ref 70–99)
Potassium: 4.3 mEq/L (ref 3.7–5.3)
Sodium: 136 mEq/L — ABNORMAL LOW (ref 137–147)
Total Bilirubin: 0.2 mg/dL — ABNORMAL LOW (ref 0.3–1.2)
Total Protein: 6.6 g/dL (ref 6.0–8.3)

## 2014-10-13 MED ORDER — LABETALOL HCL 200 MG PO TABS: 200.0000 mg | ORAL_TABLET | Freq: Two times a day (BID) | ORAL | Status: DC

## 2014-10-13 NOTE — Progress Notes (Addendum)
No complaints. Denies HA, vision change, RUQ pain, or SOB.  Active FM.  VSS.  BP nl-mild range ALT 70->57 Gen: A&O x 3 Abd: soft, NT/ND Ext: no c/c/e Reassuring FHT; nl Doppler's yesterday  44yo G1 at 3936w1d with CHTN vs GHTN, growth restriction -ALT has decreased and will discharge home with close outpt f/u. -Will schedule BPP/AFI with MFM before discharge (weekly) -Continue labetalol -Weekly HELLP labs in office  Bethany HonourMegan Aloura Matsuoka, DO

## 2014-10-13 NOTE — Progress Notes (Signed)
Reviewed with pt the signs and symptoms of increased BP to be alert for: RUQ pain, visual changes.  Pt verbalizes understanding.

## 2014-10-13 NOTE — Discharge Instructions (Signed)
Call MD for headache, chest pain, shortness of breath, right upper quadrant pain, or visual disturbance.  Call office to schedule appointment for tomorrow to recheck labs and BP check.  Continue bedrest at home.

## 2014-10-13 NOTE — Discharge Summary (Signed)
Obstetric Discharge Summary Reason for Admission: observation/evaluation Prenatal Procedures: ultrasound Intrapartum Procedures: n/a Postpartum Procedures: none Complications-Operative and Postpartum: none Hemoglobin  Date Value Ref Range Status  10/13/2014 12.5  12.0 - 15.0 g/dL Final     HCT  Date Value Ref Range Status  10/13/2014 37.5  36.0 - 46.0 % Final    Physical Exam:  General: alert, cooperative and appears stated age 65Lochia: n/a Uterine Fundus: soft Incision: n/a DVT Evaluation: No evidence of DVT seen on physical exam. Negative Homan's sign. No cords or calf tenderness.  Discharge Diagnoses: GHTN, growth restriction  Discharge Information: Date: 10/13/2014 Activity: bedrest Diet: routine Medications: PNV and labetalol Condition: stable Instructions: refer to practice specific booklet Discharge to: home   Newborn Data: <<This patient has not yet delivered during this pregnancy.>> Home with n/a.  Lennis Rader 10/13/2014, 10:26 AM

## 2014-10-18 ENCOUNTER — Other Ambulatory Visit (HOSPITAL_COMMUNITY): Payer: Self-pay | Admitting: Obstetrics and Gynecology

## 2014-10-18 DIAGNOSIS — Z3A25 25 weeks gestation of pregnancy: Secondary | ICD-10-CM

## 2014-10-18 DIAGNOSIS — O99210 Obesity complicating pregnancy, unspecified trimester: Secondary | ICD-10-CM

## 2014-10-18 DIAGNOSIS — O09512 Supervision of elderly primigravida, second trimester: Secondary | ICD-10-CM

## 2014-10-19 ENCOUNTER — Ambulatory Visit (HOSPITAL_COMMUNITY): Admit: 2014-10-19 | Payer: Federal, State, Local not specified - PPO

## 2014-10-21 ENCOUNTER — Ambulatory Visit (HOSPITAL_COMMUNITY)
Admission: RE | Admit: 2014-10-21 | Discharge: 2014-10-21 | Disposition: A | Discharge status: Home / Self Care | Payer: Federal, State, Local not specified - PPO | Source: Ambulatory Visit | Attending: Obstetrics and Gynecology | Admitting: Obstetrics and Gynecology

## 2014-10-21 ENCOUNTER — Encounter (HOSPITAL_COMMUNITY): Payer: Self-pay

## 2014-10-21 DIAGNOSIS — Z3A25 25 weeks gestation of pregnancy: Secondary | ICD-10-CM | POA: Diagnosis not present

## 2014-10-21 DIAGNOSIS — O09512 Supervision of elderly primigravida, second trimester: Secondary | ICD-10-CM | POA: Diagnosis not present

## 2014-10-21 DIAGNOSIS — O132 Gestational [pregnancy-induced] hypertension without significant proteinuria, second trimester: Secondary | ICD-10-CM

## 2014-10-21 DIAGNOSIS — O99212 Obesity complicating pregnancy, second trimester: Secondary | ICD-10-CM | POA: Insufficient documentation

## 2014-10-21 DIAGNOSIS — O365920 Maternal care for other known or suspected poor fetal growth, second trimester, not applicable or unspecified: Secondary | ICD-10-CM

## 2014-10-21 DIAGNOSIS — O99210 Obesity complicating pregnancy, unspecified trimester: Secondary | ICD-10-CM

## 2014-10-21 LAB — CHROMOSOMES ANALYSIS FOR CF

## 2014-10-22 ENCOUNTER — Other Ambulatory Visit (HOSPITAL_COMMUNITY): Payer: Self-pay | Admitting: Obstetrics and Gynecology

## 2014-10-22 ENCOUNTER — Other Ambulatory Visit (HOSPITAL_COMMUNITY): Payer: Self-pay

## 2014-10-22 DIAGNOSIS — O139 Gestational [pregnancy-induced] hypertension without significant proteinuria, unspecified trimester: Secondary | ICD-10-CM

## 2014-10-22 DIAGNOSIS — Z3A26 26 weeks gestation of pregnancy: Secondary | ICD-10-CM

## 2014-10-22 DIAGNOSIS — O09512 Supervision of elderly primigravida, second trimester: Secondary | ICD-10-CM

## 2014-10-22 DIAGNOSIS — O4102X0 Oligohydramnios, second trimester, not applicable or unspecified: Secondary | ICD-10-CM

## 2014-10-22 DIAGNOSIS — O99210 Obesity complicating pregnancy, unspecified trimester: Secondary | ICD-10-CM

## 2014-10-23 LAB — PARVOVIRUS B19 ANTIBODY, IGG AND IGM
PAROVIRUS B19 IGG ABS: 4.8 {index} — AB (ref <0.9)
PAROVIRUS B19 IGM ABS: 0.1 {index} (ref <0.9)

## 2014-10-23 LAB — CMV ANTIBODY, IGG (EIA): CMV Ab - IgG: 5.9 U/mL — ABNORMAL HIGH (ref <0.60)

## 2014-10-23 LAB — TOXOPLASMA ANTIBODIES- IGG AND  IGM: Toxoplasma Antibody- IgM: <3 IU/mL (ref <8.0)

## 2014-10-23 LAB — CMV IGM: CMV IgM: <8 AU/mL (ref <30.00)

## 2014-10-28 ENCOUNTER — Encounter (HOSPITAL_COMMUNITY): Payer: Self-pay

## 2014-10-28 ENCOUNTER — Other Ambulatory Visit (HOSPITAL_COMMUNITY): Payer: Self-pay | Admitting: Obstetrics and Gynecology

## 2014-10-28 ENCOUNTER — Ambulatory Visit (HOSPITAL_COMMUNITY)
Admission: RE | Admit: 2014-10-28 | Discharge: 2014-10-28 | Disposition: A | Discharge status: Home / Self Care | Payer: Federal, State, Local not specified - PPO | Source: Ambulatory Visit | Attending: Obstetrics and Gynecology | Admitting: Obstetrics and Gynecology

## 2014-10-28 DIAGNOSIS — O99212 Obesity complicating pregnancy, second trimester: Secondary | ICD-10-CM | POA: Insufficient documentation

## 2014-10-28 DIAGNOSIS — O10012 Pre-existing essential hypertension complicating pregnancy, second trimester: Secondary | ICD-10-CM | POA: Diagnosis not present

## 2014-10-28 DIAGNOSIS — O4102X0 Oligohydramnios, second trimester, not applicable or unspecified: Secondary | ICD-10-CM

## 2014-10-28 DIAGNOSIS — O09512 Supervision of elderly primigravida, second trimester: Secondary | ICD-10-CM

## 2014-10-28 DIAGNOSIS — O99210 Obesity complicating pregnancy, unspecified trimester: Secondary | ICD-10-CM

## 2014-10-28 DIAGNOSIS — O10912 Unspecified pre-existing hypertension complicating pregnancy, second trimester: Secondary | ICD-10-CM

## 2014-10-28 DIAGNOSIS — O09522 Supervision of elderly multigravida, second trimester: Secondary | ICD-10-CM | POA: Diagnosis not present

## 2014-10-28 DIAGNOSIS — O365120 Maternal care for known or suspected placental insufficiency, second trimester, not applicable or unspecified: Secondary | ICD-10-CM | POA: Diagnosis not present

## 2014-10-28 DIAGNOSIS — IMO0002 Reserved for concepts with insufficient information to code with codable children: Secondary | ICD-10-CM

## 2014-10-28 DIAGNOSIS — Z3A26 26 weeks gestation of pregnancy: Secondary | ICD-10-CM | POA: Insufficient documentation

## 2014-10-28 DIAGNOSIS — O139 Gestational [pregnancy-induced] hypertension without significant proteinuria, unspecified trimester: Secondary | ICD-10-CM

## 2014-10-28 DIAGNOSIS — O365920 Maternal care for other known or suspected poor fetal growth, second trimester, not applicable or unspecified: Secondary | ICD-10-CM | POA: Diagnosis present

## 2014-11-01 ENCOUNTER — Encounter (HOSPITAL_COMMUNITY): Payer: Self-pay

## 2014-11-02 ENCOUNTER — Telehealth (HOSPITAL_COMMUNITY): Payer: Self-pay | Admitting: MS"

## 2014-11-02 NOTE — Telephone Encounter (Signed)
Called Ms. Zenaida DeedLakiesha Bible regarding results of cystic fibrosis carrier screening, which was offered given the ultrasound finding of fetal echogenic bowel. Carrier screening for cystic fibrosis yielded a normal/negative result for the 32 most common disease-causing mutations, meaning that the risk to be a CF carrier can be reduced from 1 in 61 to approximately 1 in 197.  Therefore, the risk for cystic fibrosis in her current pregnancy is significantly reduced. The patient understands that CF carrier screening can reduce but not eliminate the chance to be a CF carrier.    Clydie BraunKaren Yesly Gerety  11/02/2014 2:36 PM

## 2014-11-02 NOTE — Telephone Encounter (Signed)
Left message for patient to return call.   Bethany BraunKaren Ola Rojas 11/02/2014 12:29 PM

## 2014-11-04 ENCOUNTER — Ambulatory Visit (HOSPITAL_COMMUNITY)
Admission: RE | Admit: 2014-11-04 | Discharge: 2014-11-04 | Disposition: A | Discharge status: Home / Self Care | Payer: Federal, State, Local not specified - PPO | Source: Ambulatory Visit | Attending: Obstetrics and Gynecology | Admitting: Obstetrics and Gynecology

## 2014-11-04 ENCOUNTER — Inpatient Hospital Stay (HOSPITAL_COMMUNITY)
Admission: AD | Admit: 2014-11-04 | Discharge: 2014-11-04 | Disposition: A | Discharge status: Other Acute Inpatient Hospital | Payer: Federal, State, Local not specified - PPO | Source: Ambulatory Visit | Attending: Obstetrics and Gynecology | Admitting: Obstetrics and Gynecology

## 2014-11-04 ENCOUNTER — Encounter (HOSPITAL_COMMUNITY): Payer: Self-pay | Admitting: General Practice

## 2014-11-04 DIAGNOSIS — O4102X1 Oligohydramnios, second trimester, fetus 1: Secondary | ICD-10-CM

## 2014-11-04 DIAGNOSIS — O4102X0 Oligohydramnios, second trimester, not applicable or unspecified: Secondary | ICD-10-CM | POA: Insufficient documentation

## 2014-11-04 DIAGNOSIS — O162 Unspecified maternal hypertension, second trimester: Secondary | ICD-10-CM

## 2014-11-04 DIAGNOSIS — O09522 Supervision of elderly multigravida, second trimester: Secondary | ICD-10-CM | POA: Insufficient documentation

## 2014-11-04 DIAGNOSIS — Z3A26 26 weeks gestation of pregnancy: Secondary | ICD-10-CM | POA: Insufficient documentation

## 2014-11-04 DIAGNOSIS — O99212 Obesity complicating pregnancy, second trimester: Secondary | ICD-10-CM | POA: Insufficient documentation

## 2014-11-04 DIAGNOSIS — O132 Gestational [pregnancy-induced] hypertension without significant proteinuria, second trimester: Secondary | ICD-10-CM | POA: Insufficient documentation

## 2014-11-04 DIAGNOSIS — R748 Abnormal levels of other serum enzymes: Secondary | ICD-10-CM

## 2014-11-04 DIAGNOSIS — Z3A27 27 weeks gestation of pregnancy: Secondary | ICD-10-CM | POA: Diagnosis not present

## 2014-11-04 DIAGNOSIS — O365920 Maternal care for other known or suspected poor fetal growth, second trimester, not applicable or unspecified: Secondary | ICD-10-CM | POA: Diagnosis not present

## 2014-11-04 DIAGNOSIS — O139 Gestational [pregnancy-induced] hypertension without significant proteinuria, unspecified trimester: Secondary | ICD-10-CM

## 2014-11-04 DIAGNOSIS — IMO0002 Reserved for concepts with insufficient information to code with codable children: Secondary | ICD-10-CM

## 2014-11-04 DIAGNOSIS — O365921 Maternal care for other known or suspected poor fetal growth, second trimester, fetus 1: Secondary | ICD-10-CM

## 2014-11-04 DIAGNOSIS — O09512 Supervision of elderly primigravida, second trimester: Secondary | ICD-10-CM

## 2014-11-04 DIAGNOSIS — O99210 Obesity complicating pregnancy, unspecified trimester: Secondary | ICD-10-CM

## 2014-11-04 LAB — URINE MICROSCOPIC-ADD ON

## 2014-11-04 LAB — URINALYSIS, ROUTINE W REFLEX MICROSCOPIC
BILIRUBIN URINE: NEGATIVE
Glucose, UA: NEGATIVE mg/dL
KETONES UR: NEGATIVE mg/dL
Leukocytes, UA: NEGATIVE
NITRITE: NEGATIVE
Protein, ur: NEGATIVE mg/dL
SPECIFIC GRAVITY, URINE: 1.015 (ref 1.005–1.030)
UROBILINOGEN UA: 0.2 mg/dL (ref 0.0–1.0)
pH: 6 (ref 5.0–8.0)

## 2014-11-04 LAB — COMPREHENSIVE METABOLIC PANEL
ALT: 49 U/L — AB (ref 0–35)
AST: 32 U/L (ref 0–37)
Albumin: 2.9 g/dL — ABNORMAL LOW (ref 3.5–5.2)
Alkaline Phosphatase: 70 U/L (ref 39–117)
Anion gap: 15 (ref 5–15)
BUN: 8 mg/dL (ref 6–23)
CO2: 19 mEq/L (ref 19–32)
Calcium: 9.6 mg/dL (ref 8.4–10.5)
Chloride: 103 mEq/L (ref 96–112)
Creatinine, Ser: 0.7 mg/dL (ref 0.50–1.10)
GFR calc Af Amer: >90 mL/min (ref >90)
GFR calc non Af Amer: >90 mL/min (ref >90)
Glucose, Bld: 93 mg/dL (ref 70–99)
Potassium: 4.3 mEq/L (ref 3.7–5.3)
SODIUM: 137 meq/L (ref 137–147)
TOTAL PROTEIN: 6.4 g/dL (ref 6.0–8.3)
Total Bilirubin: <0.2 mg/dL — ABNORMAL LOW (ref 0.3–1.2)

## 2014-11-04 LAB — CBC
HCT: 37.2 % (ref 36.0–46.0)
Hemoglobin: 12.4 g/dL (ref 12.0–15.0)
MCH: 27.1 pg (ref 26.0–34.0)
MCHC: 33.3 g/dL (ref 30.0–36.0)
MCV: 81.2 fL (ref 78.0–100.0)
PLATELETS: 269 10*3/uL (ref 150–400)
RBC: 4.58 MIL/uL (ref 3.87–5.11)
RDW: 16.1 % — AB (ref 11.5–15.5)
WBC: 7.3 10*3/uL (ref 4.0–10.5)

## 2014-11-04 MED ORDER — BETAMETHASONE SOD PHOS & ACET 6 (3-3) MG/ML IJ SUSP
12.0000 mg | Freq: Once | INTRAMUSCULAR | Status: AC
  Administered 2014-11-04: 12 mg via INTRAMUSCULAR
  Filled 2014-11-04: qty 2

## 2014-11-04 MED ORDER — LABETALOL HCL 100 MG PO TABS
200.0000 mg | ORAL_TABLET | Freq: Once | ORAL | Status: AC
  Administered 2014-11-04: 200 mg via ORAL
  Filled 2014-11-04: qty 2

## 2014-11-04 MED ORDER — LACTATED RINGERS IV BOLUS (SEPSIS): 1000.0000 mL | Freq: Once | INTRAVENOUS | Status: DC

## 2014-11-04 NOTE — MAU Provider Note (Signed)
Chief Complaint:  Hypertension   First Provider Initiated Contact with Patient 11/04/14 1133     HPI: Bethany Rojas is a 44 y.o. G1P0 at 3151w1d who was sent to maternity admissions from MFM to prepare for [facility], decels, SGA, abnormal dopplers because [facility] full and pt needs to be delivered. Dr Sherrie Georgeecker has spoken to Onnie BoerStephanie Pierce, [facility] who accepts transfer and has room to accommodate pt on L&D.   Pt received BMZ at 23.3 weeks. Dr. Sherrie Georgeecker recommends repeating BMZ now. RN talked to Dr. Renaldo FiddlerAdkins from [facility] where pt has been getting care. Per RN Dr. Renaldo FiddlerAdkins defers to Dr. Sherrie Georgeecker RE: POC, transfer. CNM discussed pt w/ Dr. Sherrie Georgeecker who accepts responsibility for transfer.   Denies contractions, leakage of fluid, vaginal bleeding, HA, vision changes or epigastric pain. Normal fetal movement.   Pregnancy Course: Complicate dby GHTN vs Pre-E (elevated liver enzymes), SGA, AMA, oligo  Liver enzymes mildly elevated 10/07/14-now. No baseline available. 24 hour urine 10/07/14 168.   Past Medical History: Past Medical History  Diagnosis Date  . Medical history non-contributory     Past obstetric history: OB History  Gravida Para Term Preterm AB SAB TAB Ectopic Multiple Living  1             # Outcome Date GA Lbr Len/2nd Weight Sex Delivery Anes PTL Lv  1 Current               Past Surgical History: Past Surgical History  Procedure Laterality Date  . No past surgeries       Family History: Family History  Problem Relation Age of Onset  . Cancer Mother   . Hypertension Mother     Social History: History  Substance Use Topics  . Smoking status: Never Smoker   . Smokeless tobacco: Not on file  . Alcohol Use: No    Allergies: No Known Allergies  Meds:  Prescriptions prior to admission  Medication Sig Dispense Refill Last Dose  . labetalol (NORMODYNE) 200 MG tablet Take 1 tablet (200  mg total) by mouth 2 (two) times daily. 60 tablet 1 11/03/2014 at 2230  . Prenatal Vit-Fe Fumarate-FA (PRENATAL MULTIVITAMIN) TABS tablet Take 1 tablet by mouth daily at 12 noon.   11/03/2014 at Unknown time  . acetaminophen (TYLENOL) 500 MG tablet Take 500 mg by mouth every 6 (six) hours as needed.   Past Week at Unknown time    ROS: Pertinent findings in history of present illness.  Physical Exam  Blood pressure 144/86, pulse 71, temperature 98.3 F (36.8 C), resp. rate 18, height 5\' 3"  (1.6 m), last menstrual period 04/28/2014. GENERAL: Well-developed, well-nourished, obese female in no acute distress.  HEENT: normocephalic HEART: normal rate RESP: normal effort ABDOMEN: Soft, non-tender, gravid appropriate for gestational age EXTREMITIES: Nontender, no edema NEURO: alert and oriented SPECULUM EXAM: deferred     FHT:  Baseline 150-160 , min variability, no accelerations present, variable decelerations Q 3-5 minutes lasting 30-70 seconds initially. Resolved w/ position change and IV bolus. Now rare. Contractions: None   Labs: Results for orders placed or performed during the hospital encounter of 11/04/14 (from the past 24 hour(s))  CBC     Status: Abnormal   Collection Time: 11/04/14 10:09 AM  Result Value Ref Range   WBC 7.3 4.0 - 10.5 K/uL   RBC 4.58 3.87 - 5.11 MIL/uL   Hemoglobin 12.4 12.0 -  15.0 g/dL   HCT 16.137.2 09.636.0 - 04.546.0 %   MCV 81.2 78.0 - 100.0 fL   MCH 27.1 26.0 - 34.0 pg   MCHC 33.3 30.0 - 36.0 g/dL   RDW 40.916.1 (H) 81.111.5 - 91.415.5 %   Platelets 269 150 - 400 K/uL  Comprehensive metabolic panel     Status: Abnormal   Collection Time: 11/04/14 10:09 AM  Result Value Ref Range   Sodium 137 137 - 147 mEq/L   Potassium 4.3 3.7 - 5.3 mEq/L   Chloride 103 96 - 112 mEq/L   CO2 19 19 - 32 mEq/L   Glucose, Bld 93 70 - 99 mg/dL   BUN 8 6 - 23 mg/dL   Creatinine, Ser 7.820.70 0.50 - 1.10 mg/dL   Calcium 9.6 8.4 - 95.610.5 mg/dL   Total Protein 6.4 6.0 - 8.3 g/dL   Albumin 2.9 (L)  3.5 - 5.2 g/dL   AST 32 0 - 37 U/L   ALT 49 (H) 0 - 35 U/L   Alkaline Phosphatase 70 39 - 117 U/L   Total Bilirubin <0.2 (L) 0.3 - 1.2 mg/dL   GFR calc non Af Amer >90 >90 mL/min   GFR calc Af Amer >90 >90 mL/min   Anion gap 15 5 - 15  Urinalysis, Routine w reflex microscopic     Status: Abnormal   Collection Time: 11/04/14 10:20 AM  Result Value Ref Range   Color, Urine YELLOW YELLOW   APPearance CLEAR CLEAR   Specific Gravity, Urine 1.015 1.005 - 1.030   pH 6.0 5.0 - 8.0   Glucose, UA NEGATIVE NEGATIVE mg/dL   Hgb urine dipstick TRACE (A) NEGATIVE   Bilirubin Urine NEGATIVE NEGATIVE   Ketones, ur NEGATIVE NEGATIVE mg/dL   Protein, ur NEGATIVE NEGATIVE mg/dL   Urobilinogen, UA 0.2 0.0 - 1.0 mg/dL   Nitrite NEGATIVE NEGATIVE   Leukocytes, UA NEGATIVE NEGATIVE  Urine microscopic-add on     Status: Abnormal   Collection Time: 11/04/14 10:20 AM  Result Value Ref Range   Squamous Epithelial / LPF FEW (A) RARE   WBC, UA 0-2 <3 WBC/hpf   RBC / HPF 0-2 <3 RBC/hpf   Bacteria, UA FEW (A) RARE    Imaging:    MAU Course: BMZ and Labetalol given. LR bolus.   Discussed decels w/ Dr. Sherrie Georgeecker. Continue w/ transfer. No monitoring needed in transport.   Assessment: 1. Fetal heart deceleration   2. Hypertension complicating pregnancy, second trimester   3. Oligohydramnios antepartum, second trimester, fetus 1   4. Elevated liver enzymes   5. SGA (small for gestational age), fetal, affecting care of mother, antepartum, second trimester, fetus 1     Plan: [facility] Rare decels present before transfer.   EmmaVirginia Cynthia Cogle, CNM 11/04/2014 11:55 AM

## 2014-11-04 NOTE — Progress Notes (Signed)
Koreas constant hand held for pick up of tracing

## 2014-11-04 NOTE — ED Notes (Signed)
Pt taken to MAU room 7. Report to Gwenyth Benderonna Coley, RN.

## 2014-11-04 NOTE — MAU Note (Signed)
Pt was sent to MAU from MFM for fetal heart rate decelerations. Pt is to [facility] [place].

## 2014-11-11 ENCOUNTER — Ambulatory Visit (HOSPITAL_COMMUNITY): Payer: Federal, State, Local not specified - PPO

## 2014-11-13 IMAGING — US US OB FOLLOW-UP
1 series · 12 of 28 positions shown · non-contrast
Comparison: none

[Series 1: us ob follow-up · 0.28mm/px · 55 acquisitions, 12 frames shown]
[im 3/55]
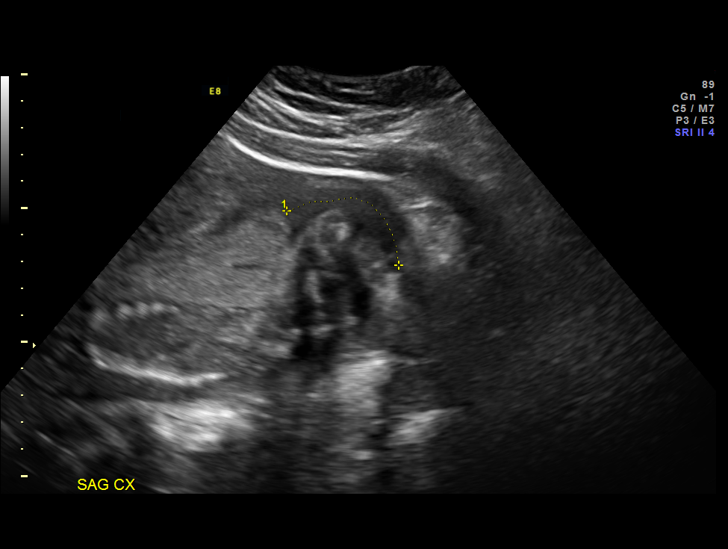
[im 7/55]
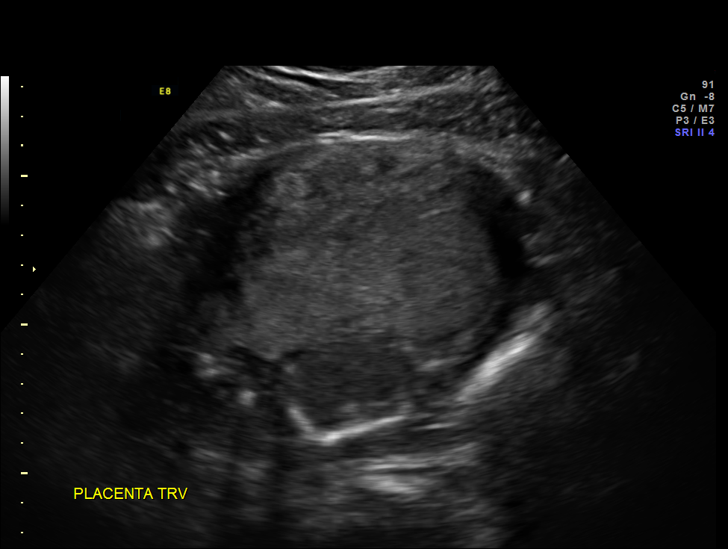
[im 11/55]
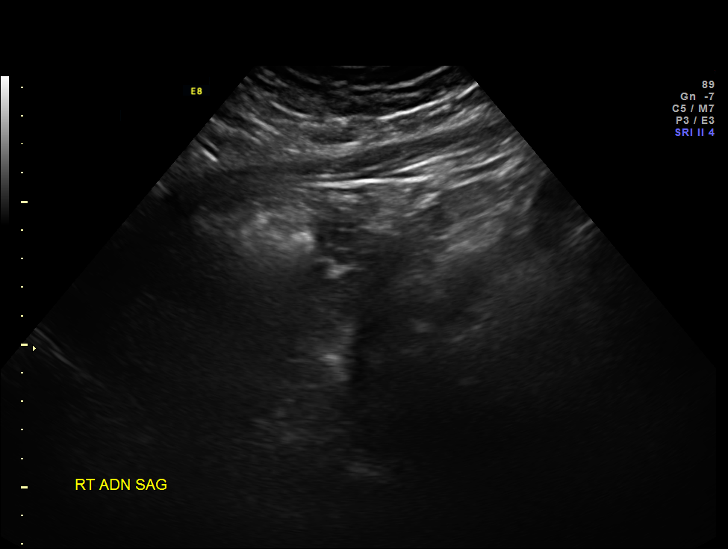
[im 17/55]
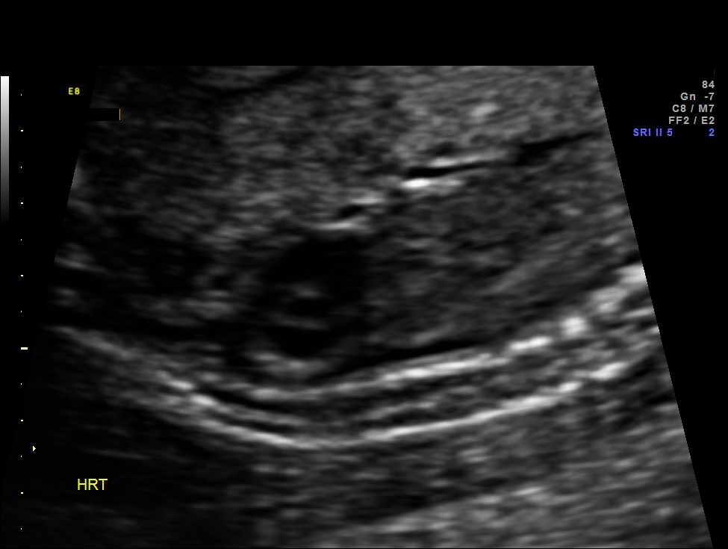
[im 21/55]
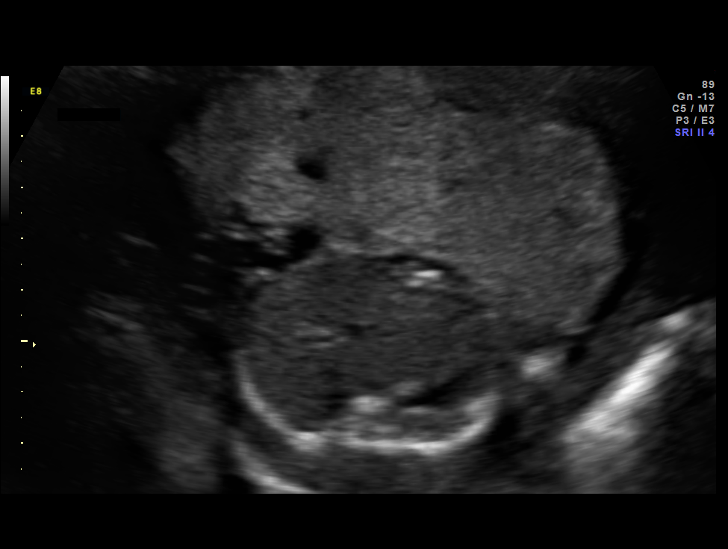
[im 25/55]
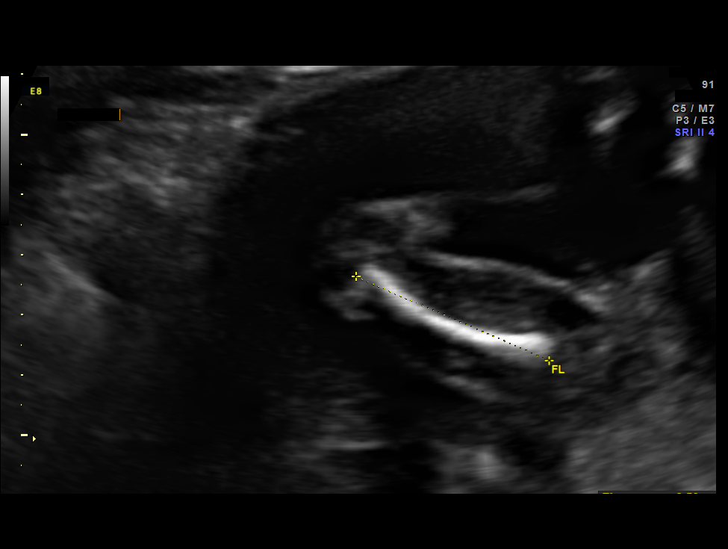
[im 31/55]
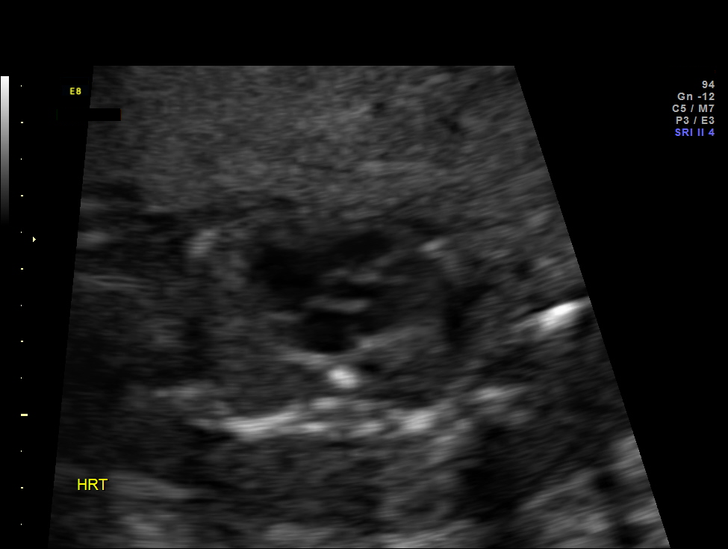
[im 35/55]
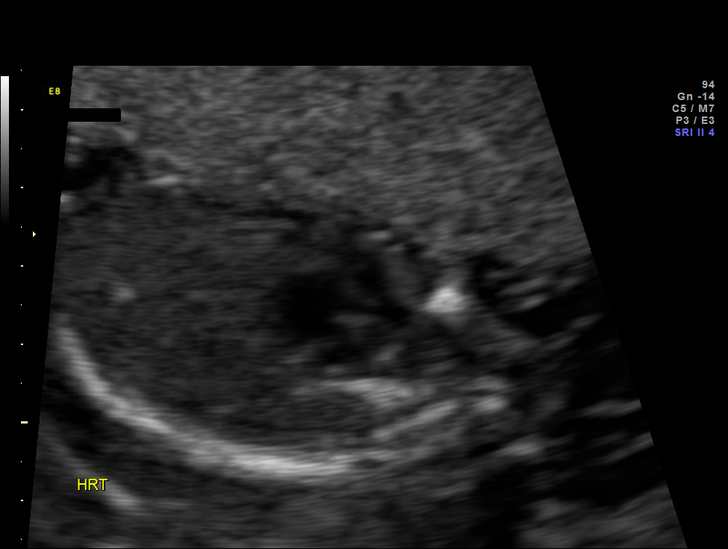
[im 39/55]
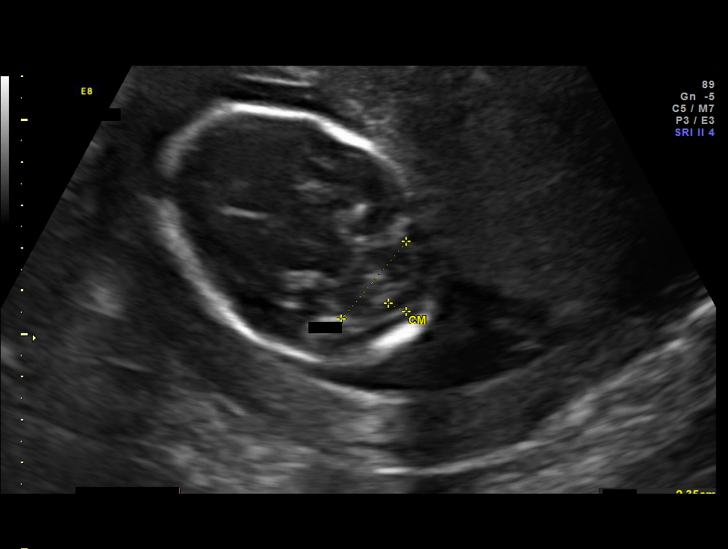
[im 45/55]
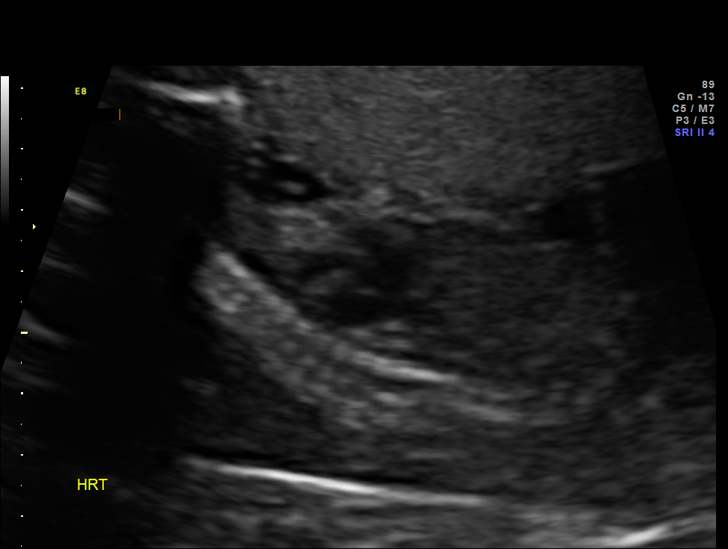
[im 49/55]
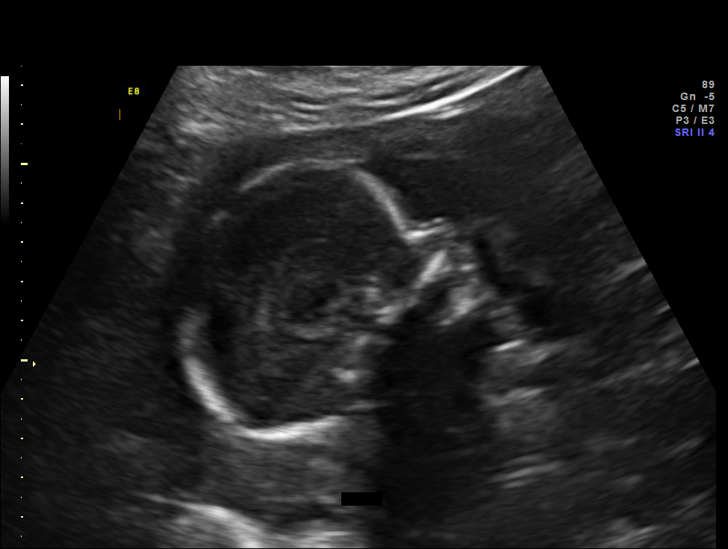
[im 53/55]
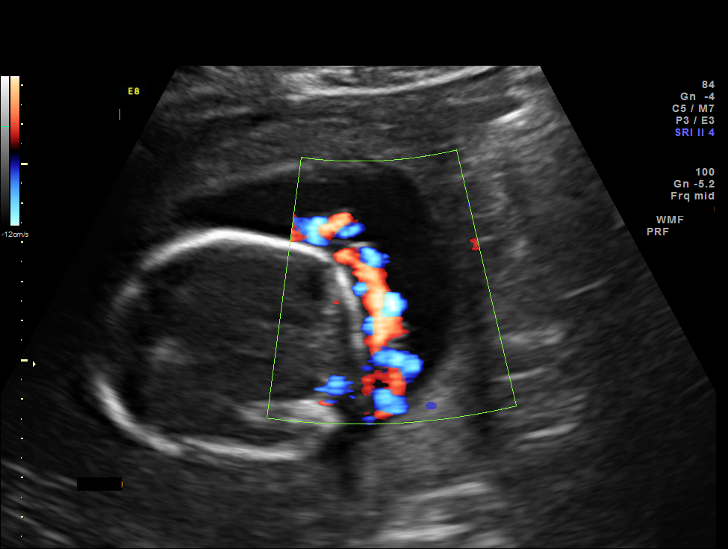

[12 of 28 positions shown; findings below may reference images not displayed]

OBSTETRICS REPORT
                      (Signed Final 10/07/2014 [DATE])

Service(s) Provided

 US OB FOLLOW UP                                       76816.1
 US UA CORD DOPPLER                                    76820.0
Indications

 Size less than dates (Small for gestational age,
 FGR)
 Advanced maternal age primigravida (44), second
 trimester - low risk NIPS
 Polycystic ovarian syndrome
 Obesity complicating pregnancy
Fetal Evaluation

 Num Of Fetuses:    1
 Fetal Heart Rate:  136                          bpm
 Cardiac Activity:  Observed
 Presentation:      Breech
 Placenta:          Anterior, above cervical os
 P. Cord            Visualized, central
 Insertion:

 Amniotic Fluid
 AFI FV:      Subjectively decreased
                                             Larg Pckt:     4.6  cm
Biometry

 BPD:     52.1  mm     G. Age:  21w 6d                CI:         69.0   70 - 86
 OFD:     75.5  mm                                    FL/HC:      17.0   19.2 -

 HC:     206.4  mm     G. Age:  22w 5d       21  %    HC/AC:      1.28   1.05 -

 AC:     161.8  mm     G. Age:  21w 2d        4  %    FL/BPD:     67.4   71 - 87
 FL:      35.1  mm     G. Age:  21w 0d      < 3  %    FL/AC:      21.7   20 - 24
 HUM:     34.1  mm     G. Age:  21w 4d       11  %
 CER:     23.5  mm     G. Age:  21w 5d       24  %

 Est. FW:     418  gm    0 lb 15 oz      17  %
Gestational Age

 LMP:           23w 1d        Date:  04/28/14                 EDD:   02/02/15
 U/S Today:     21w 5d                                        EDD:   02/12/15
 Best:          23w 1d     Det. By:  LMP  (04/28/14)          EDD:   02/02/15
Anatomy

 Cranium:          Appears normal         Aortic Arch:      Appears normal
 Fetal Cavum:      Appears normal         Ductal Arch:      Appears normal
 Ventricles:       Appears normal         Diaphragm:        Previously seen
 Choroid Plexus:   Previously seen        Stomach:          Appears normal, left
                                                            sided
 Cerebellum:       Previously seen        Abdomen:          Appears normal
 Posterior Fossa:  Previously seen        Abdominal Wall:   Previously seen
 Nuchal Fold:      Not applicable (>20    Cord Vessels:     Previously seen
                   wks GA)
 Face:             Appears normal         Kidneys:          Appear normal
                   (orbits and profile)
 Lips:             Not well visualized    Bladder:          Appears normal
 Heart:            Appears normal         Spine:            Previously seen
                   (4CH, axis, and
                   situs)
 RVOT:             Not well visualized    Lower             Previously seen
                                          Extremities:
 LVOT:             Appears normal         Upper             Previously seen
                                          Extremities:

 Other:  Female gender previously seen. Technically difficult due to maternal
         habitus and fetal position.
Targeted Anatomy

 Fetal Central Nervous System
 Cisterna Magna:
Doppler - Fetal Vessels

 Umbilical Artery
 S/D:   4.22           71  %tile       RI:
 PI:    1.28                           PSV:       28.75   cm/s
 Umbilical Artery
 Absent DFV:    No     Reverse DFV:    No

Cervix Uterus Adnexa

 Cervical Length:    4.5      cm

 Cervix:       Normal appearance by transabdominal scan.

 Adnexa:     No abnormality visualized.
Impression

 SIUP at 23+1 weeks
 EFW 17th%'le, AC 4th%'le
 UA Dopplers are technically normal
 No structural defects on interval fetal anatomy; limited views
 are detailed above
 Low amniotic fluid volume (subjectively by me, noting MVP is
 technically normal)
 Mild HTN noted during visit

 The US findings were again shared with Ms. Batiston. These
 measurements are worrisome for early growth restriction and
 implications were discussed in detail with Ms. Batiston.
Recommendations

 1.  weekly UA Doppler/AFI
 2.  interval growth in 3 weeks.
 3. patient sent to [facility] for rule out preeclampsia, noting this is
 at least Gestational HTN given two measurements of mild
 range HTN after 20 weeks (no Hx CHTN) and should be
 managed as such if preeclampsia labs are negative and no
 severe signs/symptoms are noted/reported.  Dr. Danijela Danci
 called and notified accordingly who will evaluate patient in
 [facility] and agrees with my recommendations.

 questions or concerns.

## 2014-11-27 IMAGING — US US OB LIMITED
1 series · 13 of 28 positions shown · non-contrast
Comparison: none

[Series 1: us ob limited · 0.23mm/px · 13 of 29 slices shown]
[im 2/29]
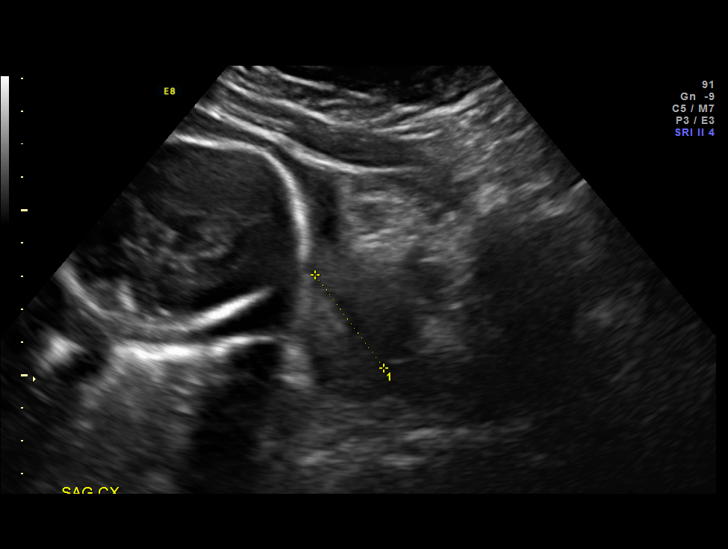
[im 4/29]
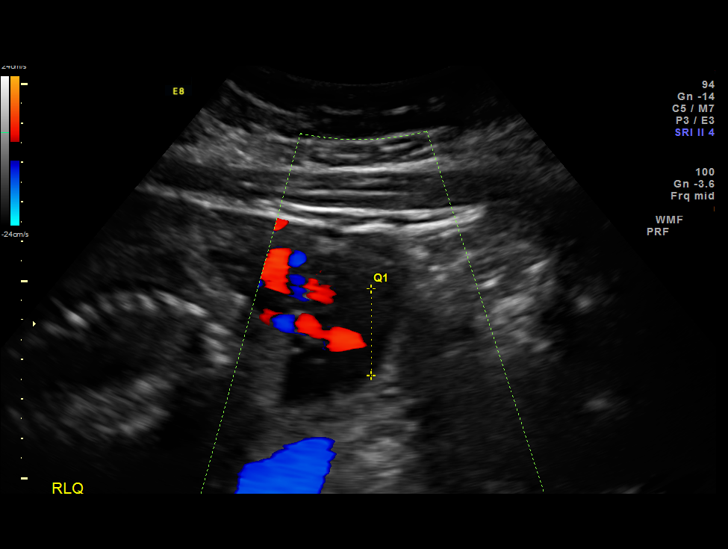
[im 6/29]
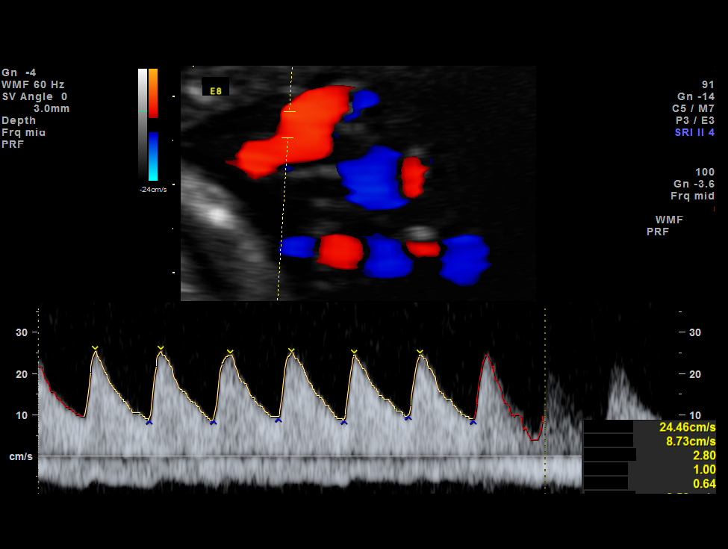
[im 8/29]
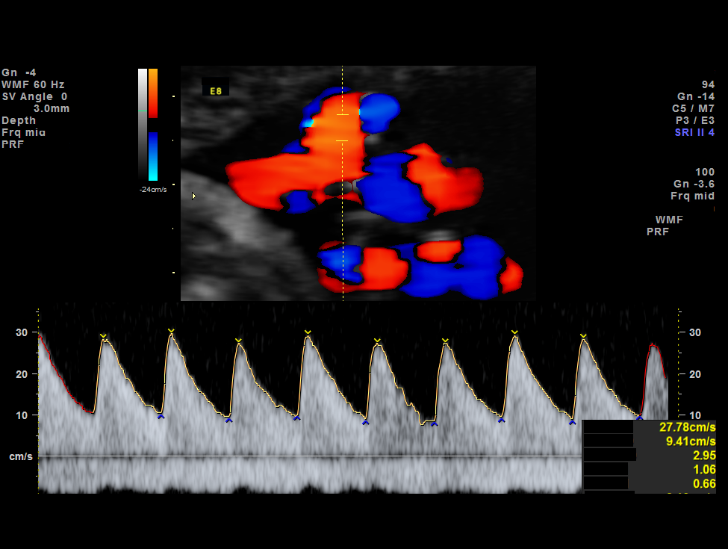
[im 10/29]
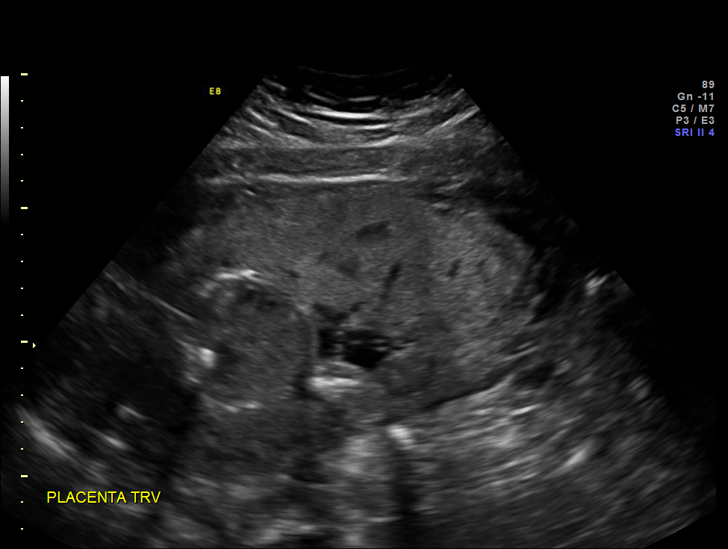
[im 12/29]
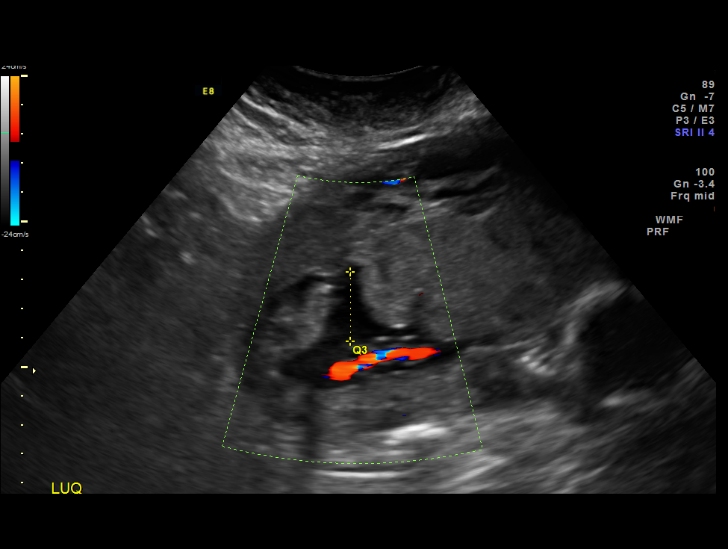
[im 15/29]
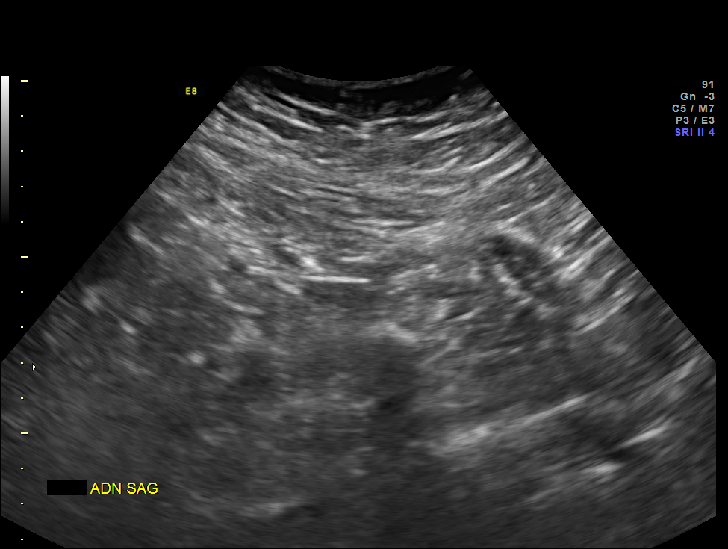
[im 17/29]
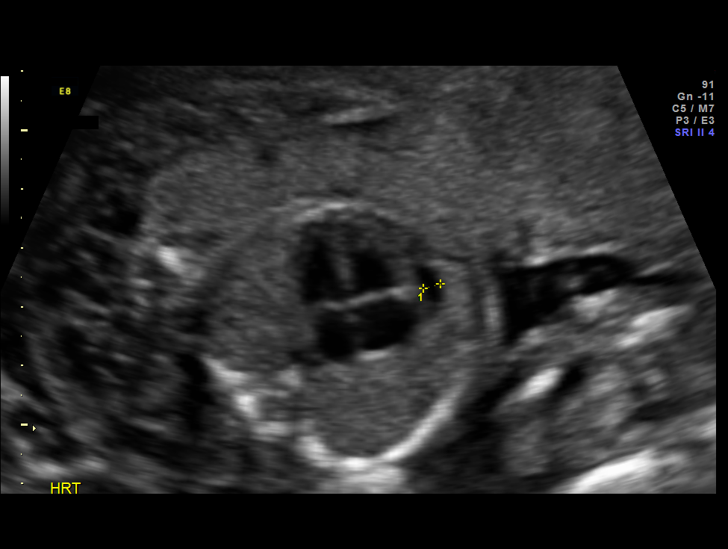
[im 19/29]
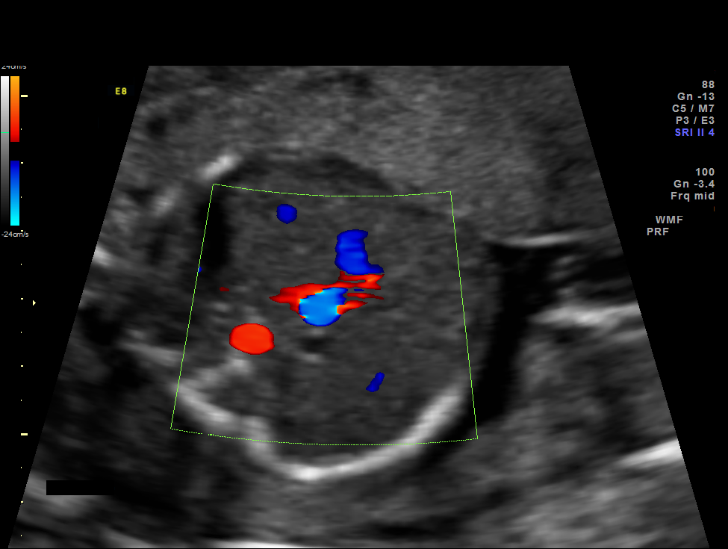
[im 21/29]
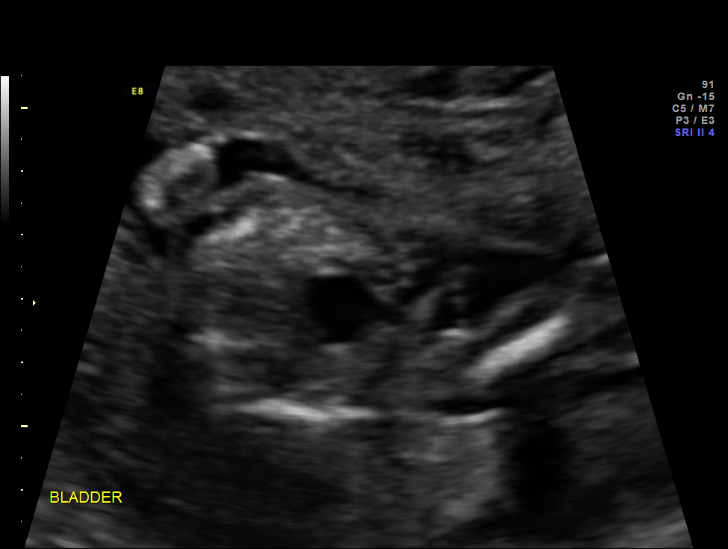
[im 23/29]
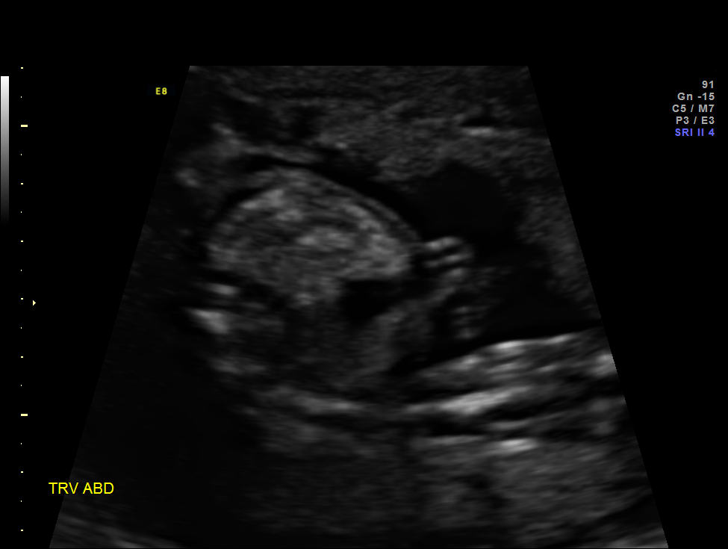
[im 25/29]
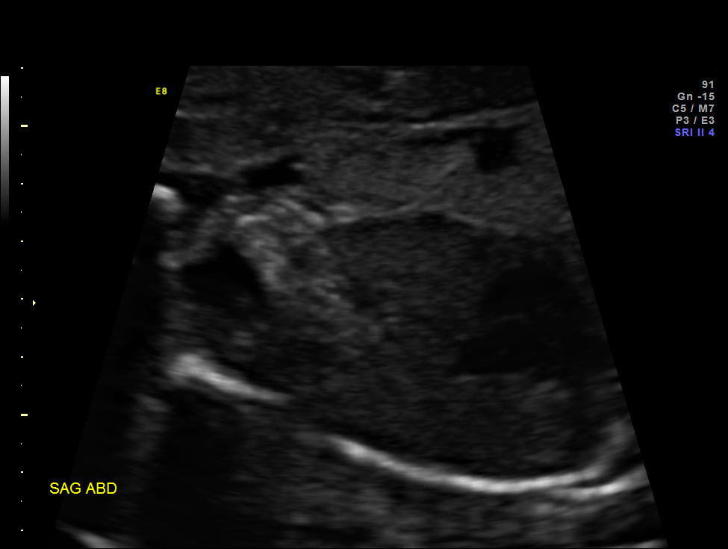
[im 27/29]
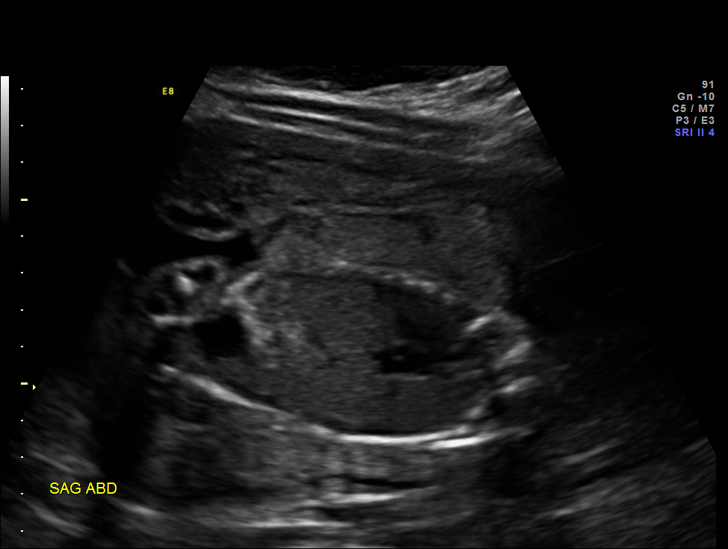

[13 of 28 positions shown; findings below may reference images not displayed]

OBSTETRICS REPORT
                      (Signed Final 10/21/2014 [DATE])

Service(s) Provided

 [HOSPITAL]                                         76815.0
 US UA DOPPLER RE-EVAL                                 76828.1
Indications

 Size less than dates (Small for gestational age,
 FGR)
 Oligohydraminios / decreased amniotic fluid,
 second trimester, unspecified
 Hypertension - Gestational (vs. CHTN) - on
 labetalol
 Advanced maternal age primigravida (44), second
 trimester - low risk NIPS
 Polycystic ovarian syndrome
 Obesity complicating pregnancy
 25 weeks gestation of pregnancy
Fetal Evaluation

 Num Of Fetuses:    1
 Fetal Heart Rate:  152                          bpm
 Cardiac Activity:  Observed
 Presentation:      Cephalic
 Placenta:          Anterior, above cervical os
 P. Cord            Previously Visualized
 Insertion:

 Amniotic Fluid
 AFI FV:      Subjectively low-normal
 AFI Sum:     9.64    cm        5  %Tile     Larg Pckt:    2.79  cm
 RUQ:   2.19    cm   RLQ:    2.28   cm    LUQ:   2.79    cm   LLQ:    2.38   cm
Gestational Age

 LMP:           25w 1d        Date:  04/28/14                 EDD:   02/02/15
 Best:          25w 1d     Det. By:  LMP  (04/28/14)          EDD:   02/02/15
Anatomy

 Abdomen:          Echogenic Bowel
Doppler - Fetal Vessels
 Umbilical Artery
 S/D:   2.95           31  %tile       RI:
 PI:    1.07                           PSV:       27.78   cm/s
 Umbilical Artery
 Absent DFV:    No     Reverse DFV:    No

Cervix Uterus Adnexa

 Cervical Length:    3.5      cm

 Cervix:       Normal appearance by transabdominal scan.

 Left Ovary:    Not visualized.
 Right Ovary:   Within normal limits.
 Adnexa:     No abnormality visualized.
Impression

 SIUP at 25+1 weeks
 Echogenic bowel - new finding today
 Low normal amniotic fluid volume
 UA dopplers were normal for this GA

Recommendations

 Follow-up ultrasound for growth and UA dopplers in one week
 Viral studies and CF screening offered and performed today

 questions or concerns.

## 2015-07-22 ENCOUNTER — Encounter (HOSPITAL_COMMUNITY): Payer: Self-pay | Admitting: *Deleted

## 2019-07-31 DIAGNOSIS — Z01419 Encounter for gynecological examination (general) (routine) without abnormal findings: Secondary | ICD-10-CM | POA: Diagnosis not present

## 2019-07-31 DIAGNOSIS — Z1231 Encounter for screening mammogram for malignant neoplasm of breast: Secondary | ICD-10-CM | POA: Diagnosis not present

## 2019-07-31 DIAGNOSIS — Z6841 Body Mass Index (BMI) 40.0 and over, adult: Secondary | ICD-10-CM | POA: Diagnosis not present

## 2019-08-07 DIAGNOSIS — Z6841 Body Mass Index (BMI) 40.0 and over, adult: Secondary | ICD-10-CM | POA: Diagnosis not present

## 2019-08-07 DIAGNOSIS — I10 Essential (primary) hypertension: Secondary | ICD-10-CM | POA: Diagnosis not present

## 2019-09-01 DIAGNOSIS — N939 Abnormal uterine and vaginal bleeding, unspecified: Secondary | ICD-10-CM | POA: Diagnosis not present

## 2019-09-01 DIAGNOSIS — N921 Excessive and frequent menstruation with irregular cycle: Secondary | ICD-10-CM | POA: Diagnosis not present

## 2019-09-01 DIAGNOSIS — N924 Excessive bleeding in the premenopausal period: Secondary | ICD-10-CM | POA: Diagnosis not present

## 2020-04-21 DIAGNOSIS — I10 Essential (primary) hypertension: Secondary | ICD-10-CM | POA: Diagnosis not present

## 2020-04-26 DIAGNOSIS — I10 Essential (primary) hypertension: Secondary | ICD-10-CM | POA: Diagnosis not present

## 2020-12-28 DIAGNOSIS — U071 COVID-19: Secondary | ICD-10-CM | POA: Diagnosis not present

## 2021-01-04 DIAGNOSIS — Z20822 Contact with and (suspected) exposure to covid-19: Secondary | ICD-10-CM | POA: Diagnosis not present

## 2021-01-07 DIAGNOSIS — Z20822 Contact with and (suspected) exposure to covid-19: Secondary | ICD-10-CM | POA: Diagnosis not present

## 2021-01-07 DIAGNOSIS — Z1152 Encounter for screening for COVID-19: Secondary | ICD-10-CM | POA: Diagnosis not present

## 2021-01-11 ENCOUNTER — Ambulatory Visit: Payer: Federal, State, Local not specified - PPO | Admitting: Physician Assistant

## 2021-01-11 ENCOUNTER — Encounter: Payer: Self-pay | Admitting: Physician Assistant

## 2021-01-11 ENCOUNTER — Other Ambulatory Visit: Payer: Self-pay

## 2021-01-11 VITALS — BP 139/90 | HR 67 | Temp 97.3°F | Resp 18 | Ht 63.0 in | Wt 253.0 lb

## 2021-01-11 DIAGNOSIS — Z13220 Encounter for screening for lipoid disorders: Secondary | ICD-10-CM

## 2021-01-11 DIAGNOSIS — I10 Essential (primary) hypertension: Secondary | ICD-10-CM | POA: Diagnosis not present

## 2021-01-11 DIAGNOSIS — Z6841 Body Mass Index (BMI) 40.0 and over, adult: Secondary | ICD-10-CM

## 2021-01-11 DIAGNOSIS — Z114 Encounter for screening for human immunodeficiency virus [HIV]: Secondary | ICD-10-CM | POA: Diagnosis not present

## 2021-01-11 DIAGNOSIS — Z1159 Encounter for screening for other viral diseases: Secondary | ICD-10-CM | POA: Diagnosis not present

## 2021-01-11 DIAGNOSIS — D649 Anemia, unspecified: Secondary | ICD-10-CM

## 2021-01-11 MED ORDER — AMLODIPINE BESYLATE 5 MG PO TABS: 5.0000 mg | ORAL_TABLET | Freq: Every day | ORAL | 0 refills | Status: DC

## 2021-01-11 NOTE — Progress Notes (Signed)
New Patient Office Visit  Subjective:  Patient ID: Bethany Rojas, female    DOB: 19-Jun-1970  Age: 51 y.o. MRN: [id number]  CC:  Chief Complaint  Patient presents with  . Hypertension    Med Refill    HPI Bethany Rojas reports that she has been treated for hypertension since her last pregnancy approximately 6 to 7 years ago.  Reports that prior to that she was not treated for this, but does endorse that she did not have many office visits or checked her blood pressure today if she had hypertension.  Reports that she has been taking amlodipine 5 mg for the past 5 years, states that she has been out for the last month.  Does not check her blood pressure at home.  Denies any hypertensive symptoms.  Reports that she had a Pap smear approximately 2 years ago, was told to have a 3-year follow-up, does have gynecologist who follows her for this.  Reports that she had scheduled her mammogram, but then did get her COVID-vaccine booster and had to reschedule her yearly mammogram.  No other concerns at this time   Past Medical History:  Diagnosis Date  . Advanced maternal age, primigravida in second trimester, antepartum 10/07/2014  . Gestational hypertension 10/07/2014  . IUGR (intrauterine growth restriction) 10/07/2014  . Medical history non-contributory   . Pregnancy induced hypertension 10/07/2014    Past Surgical History:  Procedure Laterality Date  . NO PAST SURGERIES      Family History  Problem Relation Age of Onset  . Cancer Mother   . Hypertension Mother     Social History   Socioeconomic History  . Marital status: Married    Spouse name: Not on file  . Number of children: Not on file  . Years of education: Not on file  . Highest education level: Not on file  Occupational History  . Not on file  Tobacco Use  . Smoking status: Never Smoker  . Smokeless tobacco: Never Used  Substance and Sexual Activity  . Alcohol use: No  . Drug use: No  . Sexual activity: Yes     Birth control/protection: Surgical  Other Topics Concern  . Not on file  Social History Narrative  . Not on file   Social Determinants of Health   Financial Resource Strain: Not on file  Food Insecurity: Not on file  Transportation Needs: Not on file  Physical Activity: Not on file  Stress: Not on file  Social Connections: Not on file  Intimate Partner Violence: Not on file    ROS Review of Systems  Constitutional: Negative.   HENT: Negative.   Eyes: Negative.   Respiratory: Negative for shortness of breath and wheezing.   Cardiovascular: Negative for chest pain and palpitations.  Gastrointestinal: Negative.   Endocrine: Negative.   Genitourinary: Negative.   Musculoskeletal: Negative.   Skin: Negative.   Allergic/Immunologic: Negative.   Neurological: Negative for dizziness and headaches.  Hematological: Negative.   Psychiatric/Behavioral: Negative.     Objective:   Today's Vitals: BP 139/90 (BP Location: Right Arm, Patient Position: Sitting, Cuff Size: Large)   Pulse 67   Temp (!) 97.3 F (36.3 C) (Oral)   Resp 18   Ht 5\' 3"  (1.6 m)   Wt 253 lb (114.8 kg)   LMP 01/05/2021   SpO2 98%   BMI 44.82 kg/m   Physical Exam Vitals and nursing note reviewed.   BP 139/90 (BP Location: Right Arm, Patient Position: Sitting, Cuff Size:  Large)   Pulse 67   Temp (!) 97.3 F (36.3 C) (Oral)   Resp 18   Ht 5\' 3"  (1.6 m)   Wt 253 lb (114.8 kg)   LMP 01/05/2021   SpO2 98%   BMI 44.82 kg/m   General Appearance:    Alert, cooperative, no distress, appears stated age  Head:    Normocephalic, without obvious abnormality, atraumatic  Eyes:    PERRL, conjunctiva/corneas clear, EOM's intact, fundi    benign, both eyes  Ears:    Normal  external ear canals, both ears  Nose:   Nares normal, septum midline, mucosa normal, no drainage    or sinus tenderness  Throat:   Lips, mucosa, and tongue normal; teeth and gums normal  Neck:   Supple, symmetrical, trachea midline,  no adenopathy;    thyroid:  no enlargement/tenderness/nodules; no carotid   bruit or JVD  Back:     Symmetric, no curvature, ROM normal, no CVA tenderness  Lungs:     Clear to auscultation bilaterally, respirations unlabored  Chest Wall:    No tenderness or deformity   Heart:    Regular rate and rhythm, S1 and S2 normal, no murmur, rub   or gallop              Extremities:   Extremities normal, atraumatic, no cyanosis or edema  Pulses:   2+ and symmetric all extremities  Skin:   Skin color, texture, turgor normal, no rashes or lesions  Lymph nodes:   Cervical, supraclavicular, and axillary nodes normal  Neurologic:   CNII-XII intact, normal strength, sensation and reflexes    throughout     Assessment & Plan:   Problem List Items Addressed This Visit      Cardiovascular and Mediastinum   Essential hypertension - Primary   Relevant Medications   amLODipine (NORVASC) 5 MG tablet   Other Relevant Orders   CBC with Differential/Platelet   Comp. Metabolic Panel (12)   TSH    Other Visit Diagnoses    Screening for lipid disorders       Relevant Orders   Lipid panel   Screening for HIV without presence of risk factors       Relevant Orders   HIV antibody (with reflex)   Encounter for HCV screening test for low risk patient       Relevant Orders   HCV Ab w/Rflx to Verification   Class 3 severe obesity due to excess calories with serious comorbidity and body mass index (BMI) of 40.0 to 44.9 in adult [facility])          Outpatient Encounter Medications as of 01/11/2021  Medication Sig  . acetaminophen (TYLENOL) 500 MG tablet Take 500 mg by mouth every 6 (six) hours as needed.  . [DISCONTINUED] amLODipine (NORVASC) 5 MG tablet Take 5 mg by mouth daily.  03/11/2021 amLODipine (NORVASC) 5 MG tablet Take 1 tablet (5 mg total) by mouth daily.  . [DISCONTINUED] labetalol (NORMODYNE) 200 MG tablet Take 1 tablet (200 mg total) by mouth 2 (two) times daily.  . [DISCONTINUED] Prenatal Vit-Fe  Fumarate-FA (PRENATAL MULTIVITAMIN) TABS tablet Take 1 tablet by mouth daily at 12 noon.   No facility-administered encounter medications on file as of 01/11/2021.  1. Essential hypertension Resume amlodipine 5 mg, patient's blood pressure was only slightly elevated after being out of medication for the last 30 days.  Patient encouraged to check blood pressure at home on a daily basis, keep a written log  and bring that to next office visit.  Patient education given on low-sodium diet, increasing water intake. - CBC with Differential/Platelet - Comp. Metabolic Panel (12) - TSH - amLODipine (NORVASC) 5 MG tablet; Take 1 tablet (5 mg total) by mouth daily.  Dispense: 90 tablet; Refill: 0  2. Screening for lipid disorders  - Lipid panel  3. Screening for HIV without presence of risk factors  - HIV antibody (with reflex)  4. Encounter for HCV screening test for low risk patient  - HCV Ab w/Rflx to Verification  5. Class 3 severe obesity due to excess calories with serious comorbidity and body mass index (BMI) of 40.0 to 44.9 in adult [facility])    I have reviewed the patient's medical history (PMH, PSH, Social History, Family History, Medications, and allergies) , and have been updated if relevant. I spent 30 minutes reviewing chart and  face to face time with patient.    Follow-up: Return in about 4 weeks (around 02/08/2021).   Kasandra Knudsen Danie Diehl, PA-C

## 2021-01-11 NOTE — Progress Notes (Signed)
Patient has been out of BP medication for 1 month. Patient denies any pain, HA or dizziness at this time. Patient has eaten today- candy.

## 2021-01-11 NOTE — Patient Instructions (Signed)
I sent a refill of your blood pressure medication to your pharmacy.  I encourage you to check your blood pressure at home on a daily basis, keep a written log and bring that with you to your next office visit.  I encourage you to work on a low-sodium diet, stay well-hydrated as well.  We will call you with your lab results.  Please let us know if there is any else we can do for you.  Roney Jaffe, PA-C Physician Assistant [facility] [email or web address]    How to Take Your Blood Pressure Blood pressure is a measurement of how strongly your blood is pressing against the walls of your arteries. Arteries are blood vessels that carry blood from your heart throughout your body. Your health care provider takes your blood pressure at each office visit. You can also take your own blood pressure at home with a blood pressure monitor. You may need to take your own blood pressure to:  Confirm a diagnosis of high blood pressure (hypertension).  Monitor your blood pressure over time.  Make sure your blood pressure medicine is working. Supplies needed:  Blood pressure monitor.  Dining room chair to sit in.  Table or desk.  Small notebook and pencil or pen. How to prepare To get the most accurate reading, avoid the following for 30 minutes before you check your blood pressure:  Drinking caffeine.  Drinking alcohol.  Eating.  Smoking.  Exercising. Five minutes before you check your blood pressure:  Use the bathroom and urinate so that you have an empty bladder.  Sit quietly in a dining room chair. Do not sit in a soft couch or an armchair. Do not talk. How to take your blood pressure To check your blood pressure, follow the instructions in the manual that came with your blood pressure monitor. If you have a digital blood pressure monitor, the instructions may be as follows: 1. Sit up straight in a chair. 2. Place your feet  on the floor. Do not cross your ankles or legs. 3. Rest your left arm at the level of your heart on a table or desk or on the arm of a chair. 4. Pull up your shirt sleeve. 5. Wrap the blood pressure cuff around the upper part of your left arm, 1 inch (2.5 cm) above your elbow. It is best to wrap the cuff around bare skin. 6. Fit the cuff snugly around your arm. You should be able to place only one finger between the cuff and your arm. 7. Position the cord so that it rests in the bend of your elbow. 8. Press the power button. 9. Sit quietly while the cuff inflates and deflates. 10. Read the digital reading on the monitor screen and write the numbers down (record them) in a notebook. 11. Wait 2-3 minutes, then repeat the steps, starting at step 1.   What does my blood pressure reading mean? A blood pressure reading consists of a higher number over a lower number. Ideally, your blood pressure should be below 120/80. The first ("top") number is called the systolic pressure. It is a measure of the pressure in your arteries as your heart beats. The second ("bottom") number is called the diastolic pressure. It is a measure of the pressure in your arteries as the heart relaxes. Blood pressure is classified into five stages. The following are the stages for adults who do not have a short-term serious illness or a chronic condition. Systolic pressure and diastolic  pressure are measured in a unit called mm Hg (millimeters of mercury).  Normal  Systolic pressure: below 120.  Diastolic pressure: below 80. Elevated  Systolic pressure: 120-129.  Diastolic pressure: below 80. Hypertension stage 1  Systolic pressure: 130-139.  Diastolic pressure: 80-89. Hypertension stage 2  Systolic pressure: 140 or above.  Diastolic pressure: 90 or above. You can have elevated blood pressure or hypertension even if only the systolic or only the diastolic number in your reading is higher than normal. Follow these  instructions at home:  Check your blood pressure as often as recommended by your health care provider.  Check your blood pressure at the same time every day.  Take your monitor to the next appointment with your health care provider to make sure that: ? You are using it correctly. ? It provides accurate readings.  Be sure you understand what your goal blood pressure numbers are.  Tell your health care provider if you are having any side effects from blood pressure medicine.  Keep all follow-up visits as told by your health care provider. This is important. General tips  Your health care provider can suggest a reliable monitor that will meet your needs. There are several types of home blood pressure monitors.  Choose a monitor that has an arm cuff. Do not choose a monitor that measures your blood pressure from your wrist or finger.  Choose a cuff that wraps snugly around your upper arm. You should be able to fit only one finger between your arm and the cuff.  You can buy a blood pressure monitor at most drugstores or online. Where to find more information American Heart Association: www.heart.org Contact a health care provider if:  Your blood pressure is consistently high. Get help right away if:  Your systolic blood pressure is higher than 180.  Your diastolic blood pressure is higher than 120. Summary  Blood pressure is a measurement of how strongly your blood is pressing against the walls of your arteries.  A blood pressure reading consists of a higher number over a lower number. Ideally, your blood pressure should be below 120/80.  Check your blood pressure at the same time every day.  Avoid caffeine, alcohol, smoking, and exercise for 30 minutes prior to checking your blood pressure. These agents can affect the accuracy of the blood pressure reading. This information is not intended to replace advice given to you by your health care provider. Make sure you discuss any  questions you have with your health care provider. Document Revised: 12/11/2019 Document Reviewed: 12/11/2019 Elsevier Patient Education  2021 Elsevier Inc.   Low-Sodium Eating Plan Sodium, which is an element that makes up salt, helps you maintain a healthy balance of fluids in your body. Too much sodium can increase your blood pressure and cause fluid and waste to be held in your body. Your health care provider or dietitian may recommend following this plan if you have high blood pressure (hypertension), kidney disease, liver disease, or heart failure. Eating less sodium can help lower your blood pressure, reduce swelling, and protect your heart, liver, and kidneys. What are tips for following this plan? Reading food labels  The Nutrition Facts label lists the amount of sodium in one serving of the food. If you eat more than one serving, you must multiply the listed amount of sodium by the number of servings.  Choose foods with less than 140 mg of sodium per serving.  Avoid foods with 300 mg of sodium or more  per serving. Shopping  Look for lower-sodium products, often labeled as "low-sodium" or "no salt added."  Always check the sodium content, even if foods are labeled as "unsalted" or "no salt added."  Buy fresh foods. ? Avoid canned foods and pre-made or frozen meals. ? Avoid canned, cured, or processed meats.  Buy breads that have less than 80 mg of sodium per slice.   Cooking  Eat more home-cooked food and less restaurant, buffet, and fast food.  Avoid adding salt when cooking. Use salt-free seasonings or herbs instead of table salt or sea salt. Check with your health care provider or pharmacist before using salt substitutes.  Cook with plant-based oils, such as canola, sunflower, or olive oil.   Meal planning  When eating at a restaurant, ask that your food be prepared with less salt or no salt, if possible. Avoid dishes labeled as brined, pickled, cured, smoked, or made  with soy sauce, miso, or teriyaki sauce.  Avoid foods that contain MSG (monosodium glutamate). MSG is sometimes added to [place] food, bouillon, and some canned foods.  Make meals that can be grilled, baked, poached, roasted, or steamed. These are generally made with less sodium. General information Most people on this plan should limit their sodium intake to 1,500-2,000 mg (milligrams) of sodium each day. What foods should I eat? Fruits Fresh, frozen, or canned fruit. Fruit juice. Vegetables Fresh or frozen vegetables. "No salt added" canned vegetables. "No salt added" tomato sauce and paste. Low-sodium or reduced-sodium tomato and vegetable juice. Grains Low-sodium cereals, including oats, puffed wheat and rice, and shredded wheat. Low-sodium crackers. Unsalted rice. Unsalted pasta. Low-sodium bread. Whole-grain breads and whole-grain pasta. Meats and other proteins Fresh or frozen (no salt added) meat, poultry, seafood, and fish. Low-sodium canned tuna and salmon. Unsalted nuts. Dried peas, beans, and lentils without added salt. Unsalted canned beans. Eggs. Unsalted nut butters. Dairy Milk. Soy milk. Cheese that is naturally low in sodium, such as ricotta cheese, fresh mozzarella, or Swiss cheese. Low-sodium or reduced-sodium cheese. Cream cheese. Yogurt. Seasonings and condiments Fresh and dried herbs and spices. Salt-free seasonings. Low-sodium mustard and ketchup. Sodium-free salad dressing. Sodium-free light mayonnaise. Fresh or refrigerated horseradish. Lemon juice. Vinegar. Other foods Homemade, reduced-sodium, or low-sodium soups. Unsalted popcorn and pretzels. Low-salt or salt-free chips. The items listed above may not be a complete list of foods and beverages you can eat. Contact a dietitian for more information. What foods should I avoid? Vegetables Sauerkraut, pickled vegetables, and relishes. Olives. [place] fries. Onion rings. Regular canned vegetables (not low-sodium or  reduced-sodium). Regular canned tomato sauce and paste (not low-sodium or reduced-sodium). Regular tomato and vegetable juice (not low-sodium or reduced-sodium). Frozen vegetables in sauces. Grains Instant hot cereals. Bread stuffing, pancake, and biscuit mixes. Croutons. Seasoned rice or pasta mixes. Noodle soup cups. Boxed or frozen macaroni and cheese. Regular salted crackers. Self-rising flour. Meats and other proteins Meat or fish that is salted, canned, smoked, spiced, or pickled. Precooked or cured meat, such as sausages or meat loaves. Tomasa BlaseBacon. Ham. Pepperoni. Hot dogs. Corned beef. Chipped beef. Salt pork. Jerky. Pickled herring. Anchovies and sardines. Regular canned tuna. Salted nuts. Dairy Processed cheese and cheese spreads. Hard cheeses. Cheese curds. Blue cheese. Feta cheese. String cheese. Regular cottage cheese. Buttermilk. Canned milk. Fats and oils Salted butter. Regular margarine. Ghee. Bacon fat. Seasonings and condiments Onion salt, garlic salt, seasoned salt, table salt, and sea salt. Canned and packaged gravies. Worcestershire sauce. Tartar sauce. Barbecue sauce. Teriyaki sauce. Soy sauce,  including reduced-sodium. Steak sauce. Fish sauce. Oyster sauce. Cocktail sauce. Horseradish that you find on the shelf. Regular ketchup and mustard. Meat flavorings and tenderizers. Bouillon cubes. Hot sauce. Pre-made or packaged marinades. Pre-made or packaged taco seasonings. Relishes. Regular salad dressings. Salsa. Other foods Salted popcorn and pretzels. Corn chips and puffs. Potato and tortilla chips. Canned or dried soups. Pizza. Frozen entrees and pot pies. The items listed above may not be a complete list of foods and beverages you should avoid. Contact a dietitian for more information. Summary  Eating less sodium can help lower your blood pressure, reduce swelling, and protect your heart, liver, and kidneys.  Most people on this plan should limit their sodium intake to 1,500-2,000  mg (milligrams) of sodium each day.  Canned, boxed, and frozen foods are high in sodium. Restaurant foods, fast foods, and pizza are also very high in sodium. You also get sodium by adding salt to food.  Try to cook at home, eat more fresh fruits and vegetables, and eat less fast food and canned, processed, or prepared foods. This information is not intended to replace advice given to you by your health care provider. Make sure you discuss any questions you have with your health care provider. Document Revised: 01/22/2020 Document Reviewed: 11/18/2019 Elsevier Patient Education  2021 [employer].

## 2021-01-12 ENCOUNTER — Telehealth: Payer: Self-pay | Admitting: *Deleted

## 2021-01-12 DIAGNOSIS — D649 Anemia, unspecified: Secondary | ICD-10-CM | POA: Insufficient documentation

## 2021-01-12 LAB — COMP. METABOLIC PANEL (12)
AST: 12 IU/L (ref 0–40)
Albumin/Globulin Ratio: 1.4 (ref 1.2–2.2)
Albumin: 4.2 g/dL (ref 3.8–4.8)
Alkaline Phosphatase: 74 IU/L (ref 44–121)
BUN/Creatinine Ratio: 12 (ref 9–23)
BUN: 9 mg/dL (ref 6–24)
Bilirubin Total: 0.2 mg/dL (ref 0.0–1.2)
Calcium: 9 mg/dL (ref 8.7–10.2)
Chloride: 104 mmol/L (ref 96–106)
Creatinine, Ser: 0.78 mg/dL (ref 0.57–1.00)
GFR calc Af Amer: 104 mL/min/{1.73_m2} (ref >59)
GFR calc non Af Amer: 90 mL/min/{1.73_m2} (ref >59)
Globulin, Total: 2.9 g/dL (ref 1.5–4.5)
Glucose: 84 mg/dL (ref 65–99)
Potassium: 4.2 mmol/L (ref 3.5–5.2)
Sodium: 138 mmol/L (ref 134–144)
Total Protein: 7.1 g/dL (ref 6.0–8.5)

## 2021-01-12 LAB — CBC WITH DIFFERENTIAL/PLATELET
Basophils Absolute: 0 10*3/uL (ref 0.0–0.2)
Basos: 1 %
EOS (ABSOLUTE): 0.1 10*3/uL (ref 0.0–0.4)
Eos: 1 %
Hematocrit: 37.3 % (ref 34.0–46.6)
Hemoglobin: 11 g/dL — ABNORMAL LOW (ref 11.1–15.9)
Immature Grans (Abs): 0 10*3/uL (ref 0.0–0.1)
Immature Granulocytes: 0 %
Lymphocytes Absolute: 2 10*3/uL (ref 0.7–3.1)
Lymphs: 39 %
MCH: 22.4 pg — ABNORMAL LOW (ref 26.6–33.0)
MCHC: 29.5 g/dL — ABNORMAL LOW (ref 31.5–35.7)
MCV: 76 fL — ABNORMAL LOW (ref 79–97)
Monocytes Absolute: 0.6 10*3/uL (ref 0.1–0.9)
Monocytes: 11 %
Neutrophils Absolute: 2.5 10*3/uL (ref 1.4–7.0)
Neutrophils: 48 %
Platelets: 404 10*3/uL (ref 150–450)
RBC: 4.9 x10E6/uL (ref 3.77–5.28)
RDW: 16.8 % — ABNORMAL HIGH (ref 11.7–15.4)
WBC: 5.2 10*3/uL (ref 3.4–10.8)

## 2021-01-12 LAB — HIV ANTIBODY (ROUTINE TESTING W REFLEX): HIV Screen 4th Generation wRfx: NONREACTIVE

## 2021-01-12 LAB — LIPID PANEL
Chol/HDL Ratio: 2.8 ratio (ref 0.0–4.4)
Cholesterol, Total: 152 mg/dL (ref 100–199)
HDL: 55 mg/dL (ref >39)
LDL Chol Calc (NIH): 80 mg/dL (ref 0–99)
Triglycerides: 94 mg/dL (ref 0–149)
VLDL Cholesterol Cal: 17 mg/dL (ref 5–40)

## 2021-01-12 LAB — HCV AB W/RFLX TO VERIFICATION: HCV Ab: <0.1 s/co ratio (ref 0.0–0.9)

## 2021-01-12 LAB — TSH: TSH: 2.08 u[IU]/mL (ref 0.450–4.500)

## 2021-01-12 LAB — HCV INTERPRETATION

## 2021-01-12 NOTE — Telephone Encounter (Signed)
MA LVM for patient. Patient has viewed results and recommendation via mychart. Patient needs to be scheduled for a FU lab visit.

## 2021-01-12 NOTE — Telephone Encounter (Signed)
-----   Message from Roney Jaffe, [place] sent at 01/12/2021  9:27 AM EST ----- Please call patient and let her know that her kidney and liver function, and thyroid function are within normal limits.  Her screening for hepatitis C and HIV was negative.  Her cholesterol level overall is within normal limits. She does show signs of possible iron deficiency anemia.  I would like her to come in for a follow-up lab she does not need to be fasting.  I also would like her to go ahead and start iron supplements, Start with low dose of ferrous sulfate 325 mgm once daily, and if tolerated, increase dose to twice daily.  She can purchase this over-the-counter.

## 2021-01-12 NOTE — Addendum Note (Signed)
Addended by: Roney Jaffe on: 01/12/2021 09:27 AM   Modules accepted: Orders

## 2021-01-24 ENCOUNTER — Ambulatory Visit: Payer: Federal, State, Local not specified - PPO | Admitting: Family

## 2021-02-15 ENCOUNTER — Ambulatory Visit: Payer: Federal, State, Local not specified - PPO | Admitting: Family

## 2021-03-28 ENCOUNTER — Encounter: Payer: Self-pay | Admitting: Internal Medicine

## 2021-04-03 ENCOUNTER — Other Ambulatory Visit: Payer: Self-pay | Admitting: Physician Assistant

## 2021-04-03 DIAGNOSIS — I10 Essential (primary) hypertension: Secondary | ICD-10-CM

## 2021-04-07 ENCOUNTER — Telehealth: Payer: Federal, State, Local not specified - PPO | Admitting: Internal Medicine

## 2021-04-11 ENCOUNTER — Encounter: Payer: Federal, State, Local not specified - PPO | Admitting: Internal Medicine

## 2021-05-05 ENCOUNTER — Ambulatory Visit: Payer: Federal, State, Local not specified - PPO | Admitting: Internal Medicine

## 2021-05-05 ENCOUNTER — Other Ambulatory Visit: Payer: Self-pay

## 2021-05-05 VITALS — BP 135/91 | HR 69 | Resp 16 | Ht 63.0 in | Wt 249.4 lb

## 2021-05-05 DIAGNOSIS — I10 Essential (primary) hypertension: Secondary | ICD-10-CM | POA: Diagnosis not present

## 2021-05-05 DIAGNOSIS — D649 Anemia, unspecified: Secondary | ICD-10-CM | POA: Diagnosis not present

## 2021-05-05 DIAGNOSIS — T148XXA Other injury of unspecified body region, initial encounter: Secondary | ICD-10-CM | POA: Diagnosis not present

## 2021-05-05 DIAGNOSIS — Z7689 Persons encountering health services in other specified circumstances: Secondary | ICD-10-CM | POA: Diagnosis not present

## 2021-05-05 MED ORDER — CYCLOBENZAPRINE HCL 5 MG PO TABS: 5.0000 mg | ORAL_TABLET | Freq: Three times a day (TID) | ORAL | 1 refills | Status: DC | PRN

## 2021-05-05 MED ORDER — AMLODIPINE BESYLATE 5 MG PO TABS: 5.0000 mg | ORAL_TABLET | Freq: Every day | ORAL | 5 refills | Status: DC

## 2021-05-05 NOTE — Progress Notes (Signed)
Pt states she is having pain on her right side that comes and goes

## 2021-05-05 NOTE — Progress Notes (Signed)
Subjective:    Bethany Rojas - 51 y.o. female MRN [id number]  Date of birth: [birth year]  HPI  Bethany Rojas is to establish care. Patient has a PMH significant for HTN and iron deficiency anemia. Has an OB with whom she follows for cervical cancer screening. Reports she ran out of Amlodipine a few days ago, needs refill. Does not monitor at home. She has been taking Fe supplements since Jan when noted to have mild anemia. Ok with monitoring labs today. Does endorse one week of right sided pain located over the ribs and down her side. Noticed after walking more than usual. Sharp, tightening pain that occurs when she wakes up in the morning trying to get out of bed and when she does more physical activity than usual. Afebrile. No vomiting or nausea. Pain does not radiate to her leg or back. No associated weakness of extremity.    ROS per HPI     Health Maintenance:  Health Maintenance Due  Topic Date Due  . PAP SMEAR-Modifier  Never done  . COLONOSCOPY (Pts 45-67yrs Insurance coverage will need to be confirmed)  Never done  . COVID-19 Vaccine (3 - Booster for [employer] series) 09/30/2020     Past Medical History: Patient Active Problem List   Diagnosis Date Noted  . Anemia 01/12/2021  . Essential hypertension 01/11/2021  . Morbid obesity (HCC) 10/07/2014      Social History   reports that she has never smoked. She has never used smokeless tobacco. She reports that she does not drink alcohol and does not use drugs.   Family History  family history includes Cancer in her mother; Hypertension in her mother.   Medications: reviewed and updated   Objective:   Physical Exam BP (!) 135/91   Pulse 69   Resp 16   Ht 5\' 3"  (1.6 m)   Wt 249 lb 6.4 oz (113.1 kg)   LMP 04/24/2021   SpO2 95%   BMI 44.18 kg/m  Physical Exam Constitutional:      General: She is not in acute distress.    Appearance: She is not diaphoretic.  HENT:     Head: Normocephalic and atraumatic.  Eyes:      Conjunctiva/sclera: Conjunctivae normal.  Cardiovascular:     Rate and Rhythm: Normal rate and regular rhythm.     Heart sounds: Normal heart sounds. No murmur heard.   Pulmonary:     Effort: Pulmonary effort is normal. No respiratory distress.     Breath sounds: Normal breath sounds.  Musculoskeletal:        General: Normal range of motion.     Comments: No TTP over right lateral torso.   Skin:    General: Skin is warm and dry.  Neurological:     Mental Status: She is alert and oriented to person, place, and time.  Psychiatric:        Mood and Affect: Affect normal.        Judgment: Judgment normal.         Assessment & Plan:    1. Encounter to establish care Reviewed patient's PMH, social history, surgical history, and medications.   2. Essential hypertension BP slightly above goal, likely related to being out of medication for a couple days. Refill and monitor. Asymptomatic.  - amLODipine (NORVASC) 5 MG tablet; Take 1 tablet (5 mg total) by mouth daily.  Dispense: 30 tablet; Refill: 5  3. Anemia, unspecified type Will monitor HgB.  - CBC  4. Muscle  strain Suspect pain related to muscle strain from increase in physical activity. No red flags on history. Trial of muscle relaxer and other supportive care.  - cyclobenzaprine (FLEXERIL) 5 MG tablet; Take 1-2 tablets (5-10 mg total) by mouth 3 (three) times daily as needed for muscle spasms.  Dispense: 30 tablet; Refill: 1    Marcy Siren, D.O. 05/05/2021, 9:09 AM Primary Care at [facility]

## 2021-05-06 LAB — CBC
Hematocrit: 40.7 % (ref 34.0–46.6)
Hemoglobin: 12.6 g/dL (ref 11.1–15.9)
MCH: 25 pg — ABNORMAL LOW (ref 26.6–33.0)
MCHC: 31 g/dL — ABNORMAL LOW (ref 31.5–35.7)
MCV: 81 fL (ref 79–97)
Platelets: 328 10*3/uL (ref 150–450)
RBC: 5.05 x10E6/uL (ref 3.77–5.28)
RDW: 16.4 % — ABNORMAL HIGH (ref 11.7–15.4)
WBC: 5.3 10*3/uL (ref 3.4–10.8)

## 2021-06-07 ENCOUNTER — Ambulatory Visit: Payer: Federal, State, Local not specified - PPO | Admitting: Internal Medicine

## 2021-10-11 ENCOUNTER — Other Ambulatory Visit: Payer: Self-pay | Admitting: *Deleted

## 2021-10-11 DIAGNOSIS — I10 Essential (primary) hypertension: Secondary | ICD-10-CM

## 2021-10-11 MED ORDER — AMLODIPINE BESYLATE 5 MG PO TABS: 5.0000 mg | ORAL_TABLET | Freq: Every day | ORAL | 0 refills | Status: DC

## 2021-10-12 DIAGNOSIS — Z01419 Encounter for gynecological examination (general) (routine) without abnormal findings: Secondary | ICD-10-CM | POA: Diagnosis not present

## 2021-10-12 DIAGNOSIS — Z1231 Encounter for screening mammogram for malignant neoplasm of breast: Secondary | ICD-10-CM | POA: Diagnosis not present

## 2022-02-26 ENCOUNTER — Other Ambulatory Visit: Payer: Self-pay | Admitting: Family Medicine

## 2022-02-26 DIAGNOSIS — I10 Essential (primary) hypertension: Secondary | ICD-10-CM

## 2022-02-28 ENCOUNTER — Encounter: Payer: Self-pay | Admitting: Family Medicine

## 2022-02-28 ENCOUNTER — Ambulatory Visit: Payer: Federal, State, Local not specified - PPO | Admitting: Family Medicine

## 2022-02-28 ENCOUNTER — Other Ambulatory Visit: Payer: Self-pay

## 2022-02-28 VITALS — BP 134/83 | HR 82 | Temp 98.1°F | Resp 16 | Wt 260.2 lb

## 2022-02-28 DIAGNOSIS — Z6841 Body Mass Index (BMI) 40.0 and over, adult: Secondary | ICD-10-CM

## 2022-02-28 DIAGNOSIS — E66813 Obesity, class 3: Secondary | ICD-10-CM

## 2022-02-28 DIAGNOSIS — I10 Essential (primary) hypertension: Secondary | ICD-10-CM | POA: Diagnosis not present

## 2022-02-28 DIAGNOSIS — Z3202 Encounter for pregnancy test, result negative: Secondary | ICD-10-CM

## 2022-02-28 DIAGNOSIS — N926 Irregular menstruation, unspecified: Secondary | ICD-10-CM

## 2022-02-28 MED ORDER — PHENTERMINE HCL 37.5 MG PO CAPS: 37.5000 mg | ORAL_CAPSULE | ORAL | 0 refills | Status: DC

## 2022-02-28 MED ORDER — AMLODIPINE BESYLATE 5 MG PO TABS: 5.0000 mg | ORAL_TABLET | Freq: Every day | ORAL | 1 refills | Status: DC

## 2022-02-28 NOTE — Progress Notes (Signed)
Patient is here for medication refill. ? ?Patient c/o having missed 1 month menstrual cycle. Patient concern it may be premenopausal ? ?

## 2022-03-01 LAB — POCT URINE PREGNANCY: Preg Test, Ur: NEGATIVE

## 2022-03-01 NOTE — Progress Notes (Signed)
? ?Established Patient Office Visit ? ?Subjective:  ?Patient ID: Bethany Rojas, female    DOB: Apr 28, 1970  Age: 52 y.o. MRN: [id number] ? ?CC:  ?Chief Complaint  ?Patient presents with  ? Medication Refill  ? Amenorrhea  ? ? ?HPI ?Bethany Rojas presents for follow up of hypertension. She also reports that she has missed her last period.  ? ?Past Medical History:  ?Diagnosis Date  ? Advanced maternal age, primigravida in second trimester, antepartum 10/07/2014  ? Gestational hypertension 10/07/2014  ? Hypertension   ? Phreesia 05/05/2021  ? IUGR (intrauterine growth restriction) 10/07/2014  ? Medical history non-contributory   ? Pregnancy induced hypertension 10/07/2014  ? ? ?Past Surgical History:  ?Procedure Laterality Date  ? CESAREAN SECTION N/A   ? Phreesia 05/05/2021  ? NO PAST SURGERIES    ? ? ?Family History  ?Problem Relation Age of Onset  ? Cancer Mother   ? Hypertension Mother   ? ? ?Social History  ? ?Socioeconomic History  ? Marital status: Married  ?  Spouse name: Not on file  ? Number of children: Not on file  ? Years of education: Not on file  ? Highest education level: Not on file  ?Occupational History  ? Not on file  ?Tobacco Use  ? Smoking status: Never  ? Smokeless tobacco: Never  ?Substance and Sexual Activity  ? Alcohol use: No  ? Drug use: No  ? Sexual activity: Yes  ?  Birth control/protection: Surgical  ?Other Topics Concern  ? Not on file  ?Social History Narrative  ? Not on file  ? ?Social Determinants of Health  ? ?Financial Resource Strain: Not on file  ?Food Insecurity: Not on file  ?Transportation Needs: Not on file  ?Physical Activity: Not on file  ?Stress: Not on file  ?Social Connections: Not on file  ?Intimate Partner Violence: Not on file  ? ? ?ROS ?Review of Systems  ?Cardiovascular: Negative.   ?Genitourinary:  Positive for menstrual problem.  ?All other systems reviewed and are negative. ? ?Objective:  ? ?Today's Vitals: BP 134/83   Pulse 82   Temp 98.1 ?F (36.7 ?C) (Oral)    Resp 16   Wt 260 lb 3.2 oz (118 kg)   SpO2 93%   BMI 46.09 kg/m?  ? ?Physical Exam ?Vitals and nursing note reviewed.  ?Constitutional:   ?   General: She is not in acute distress. ?   Appearance: She is obese.  ?Cardiovascular:  ?   Rate and Rhythm: Normal rate and regular rhythm.  ?Pulmonary:  ?   Effort: Pulmonary effort is normal.  ?   Breath sounds: Normal breath sounds.  ?Abdominal:  ?   Palpations: Abdomen is soft.  ?   Tenderness: There is no abdominal tenderness.  ?Neurological:  ?   General: No focal deficit present.  ?   Mental Status: She is alert and oriented to person, place, and time.  ? ? ?Assessment & Plan:  ? ?1. Essential hypertension ?Appears stable with present management. Continue and monitor ?- amLODipine (NORVASC) 5 MG tablet; Take 1 tablet (5 mg total) by mouth daily.  Dispense: 90 tablet; Refill: 1 ? ?2. Missed period ?Probable perimenopausal. Discussed BC options. ?- POCT urine pregnancy ? ?3. Class 3 severe obesity due to excess calories with serious comorbidity and body mass index (BMI) of 45.0 to 49.9 in adult [facility]) ?Discussed dietary and activity options. Patient prescribed phentermine. Goal is 4-6lbs wt loss in next month.  ? ?Outpatient Encounter  Medications as of 02/28/2022  ?Medication Sig  ? acetaminophen (TYLENOL) 500 MG tablet Take 500 mg by mouth every 6 (six) hours as needed.  ? cyclobenzaprine (FLEXERIL) 5 MG tablet Take 1-2 tablets (5-10 mg total) by mouth 3 (three) times daily as needed for muscle spasms.  ? ferrous sulfate 325 (65 FE) MG tablet Take 325 mg by mouth 2 (two) times daily.  ? phentermine 37.5 MG capsule Take 1 capsule (37.5 mg total) by mouth every morning.  ? [DISCONTINUED] amLODipine (NORVASC) 5 MG tablet Take 1 tablet (5 mg total) by mouth daily.  ? amLODipine (NORVASC) 5 MG tablet Take 1 tablet (5 mg total) by mouth daily.  ? ?No facility-administered encounter medications on file as of 02/28/2022.  ? ? ?Follow-up: No follow-ups on file.  ? ?Tommie Raymond, MD ? ?

## 2022-04-03 ENCOUNTER — Ambulatory Visit: Payer: Federal, State, Local not specified - PPO | Admitting: Family Medicine

## 2022-06-07 ENCOUNTER — Ambulatory Visit: Payer: Federal, State, Local not specified - PPO | Admitting: Family Medicine

## 2022-07-04 ENCOUNTER — Ambulatory Visit: Payer: Federal, State, Local not specified - PPO | Admitting: Family Medicine

## 2022-07-04 ENCOUNTER — Encounter: Payer: Self-pay | Admitting: Family Medicine

## 2022-07-04 VITALS — BP 124/80 | HR 71 | Temp 98.1°F | Resp 16 | Ht 63.0 in | Wt 259.2 lb

## 2022-07-04 DIAGNOSIS — Z7689 Persons encountering health services in other specified circumstances: Secondary | ICD-10-CM | POA: Diagnosis not present

## 2022-07-04 DIAGNOSIS — Z6841 Body Mass Index (BMI) 40.0 and over, adult: Secondary | ICD-10-CM

## 2022-07-04 NOTE — Progress Notes (Signed)
Established Patient Office Visit  Subjective    Patient ID: Bethany Rojas, female    DOB: December 03, 1970  Age: 52 y.o. MRN: [id number]  CC: No chief complaint on file.   HPI Bethany Rojas presents for weight management. She reports that she was unable to tolerate the phentermine and would like to start the wegovy.    Outpatient Encounter Medications as of 07/04/2022  Medication Sig   acetaminophen (TYLENOL) 500 MG tablet Take 500 mg by mouth every 6 (six) hours as needed.   amLODipine (NORVASC) 5 MG tablet Take 1 tablet by mouth daily.   cyclobenzaprine (FLEXERIL) 5 MG tablet Take 1-2 tablets (5-10 mg total) by mouth 3 (three) times daily as needed for muscle spasms.   ferrous sulfate 325 (65 FE) MG tablet Take 325 mg by mouth 2 (two) times daily.   phentermine 37.5 MG capsule Take 1 capsule (37.5 mg total) by mouth every morning. (Patient not taking: Reported on 07/04/2022)   [DISCONTINUED] amLODipine (NORVASC) 5 MG tablet Take 1 tablet (5 mg total) by mouth daily.   No facility-administered encounter medications on file as of 07/04/2022.    Past Medical History:  Diagnosis Date   Advanced maternal age, primigravida in second trimester, antepartum 10/07/2014   Gestational hypertension 10/07/2014   Hypertension    [employer]reesia 05/05/2021   IUGR (intrauterine growth restriction) 10/07/2014   Medical history non-contributory    Pregnancy induced hypertension 10/07/2014    Past Surgical History:  Procedure Laterality Date   CESAREAN SECTION N/A    [employer]reesia 05/05/2021   NO PAST SURGERIES      Family History  Problem Relation Age of Onset   Cancer Mother    Hypertension Mother     Social History   Socioeconomic History   Marital status: Married    Spouse name: Not on file   Number of children: Not on file   Years of education: Not on file   Highest education level: Not on file  Occupational History   Not on file  Tobacco Use   Smoking status: Never   Smokeless tobacco: Never   Substance and Sexual Activity   Alcohol use: No   Drug use: No   Sexual activity: Yes    Birth control/protection: Surgical  Other Topics Concern   Not on file  Social History Narrative   Not on file   Social Determinants of Health   Financial Resource Strain: Not on file  Food Insecurity: Not on file  Transportation Needs: Not on file  Physical Activity: Not on file  Stress: Not on file  Social Connections: Not on file  Intimate Partner Violence: Not on file    Review of Systems  All other systems reviewed and are negative.       Objective    BP 124/80   Pulse 71   Temp 98.1 F (36.7 C)   Resp 16   Ht 5\' 3"  (1.6 m)   Wt 259 lb 3.2 oz (117.6 kg)   SpO2 94%   BMI 45.92 kg/m   Physical Exam Vitals and nursing note reviewed.  Constitutional:      General: She is not in acute distress.    Appearance: She is obese.  Cardiovascular:     Rate and Rhythm: Normal rate and regular rhythm.  Pulmonary:     Effort: Pulmonary effort is normal.     Breath sounds: Normal breath sounds.  Abdominal:     Palpations: Abdomen is soft.  Tenderness: There is no abdominal tenderness.  Neurological:     General: No focal deficit present.     Mental Status: She is alert and oriented to person, place, and time.         Assessment & Plan:   1. Encounter for weight management As wegovy is on backorder, will defer start until wegovy is available  2. Class 3 severe obesity due to excess calories with serious comorbidity and body mass index (BMI) of 45.0 to 49.9 in adult [facility]) Discussed dietary and activity options.     No follow-ups on file.   Tommie Raymond, MD

## 2022-07-04 NOTE — Progress Notes (Signed)
Patient is here for weight loss management. Patient was previously on  phentermine.

## 2022-08-23 DIAGNOSIS — E860 Dehydration: Secondary | ICD-10-CM | POA: Diagnosis not present

## 2022-08-23 DIAGNOSIS — I880 Nonspecific mesenteric lymphadenitis: Secondary | ICD-10-CM | POA: Diagnosis not present

## 2022-08-23 DIAGNOSIS — A483 Toxic shock syndrome: Secondary | ICD-10-CM | POA: Diagnosis not present

## 2022-08-23 DIAGNOSIS — A419 Sepsis, unspecified organism: Secondary | ICD-10-CM | POA: Diagnosis not present

## 2022-08-30 ENCOUNTER — Encounter: Payer: Self-pay | Admitting: Family Medicine

## 2022-09-17 MED ORDER — AMLODIPINE BESYLATE 5 MG PO TABS: 5.0000 mg | ORAL_TABLET | Freq: Every day | ORAL | 0 refills | Status: DC

## 2022-10-30 ENCOUNTER — Encounter: Payer: Self-pay | Admitting: Family Medicine

## 2022-11-06 NOTE — Telephone Encounter (Signed)
You said patient do not need to come in until after first dose

## 2022-12-13 DIAGNOSIS — Z1231 Encounter for screening mammogram for malignant neoplasm of breast: Secondary | ICD-10-CM | POA: Diagnosis not present

## 2022-12-13 DIAGNOSIS — Z01419 Encounter for gynecological examination (general) (routine) without abnormal findings: Secondary | ICD-10-CM | POA: Diagnosis not present

## 2022-12-13 DIAGNOSIS — N951 Menopausal and female climacteric states: Secondary | ICD-10-CM | POA: Diagnosis not present

## 2022-12-29 ENCOUNTER — Other Ambulatory Visit: Payer: Self-pay | Admitting: Family Medicine

## 2022-12-30 NOTE — Telephone Encounter (Signed)
Requested medication (s) are due for refill today: yes  Requested medication (s) are on the active medication list: yes  Last refill:  09/17/22 #90  Future visit scheduled: no  Notes to clinic:  sent pt MyChart message to call office to schedule a med refill appt.   Requested Prescriptions  Pending Prescriptions Disp Refills   amLODipine (NORVASC) 5 MG tablet [Pharmacy Med Name: AMLODIPINE BESYLATE 5 MG TAB] 90 tablet 0    Sig: Take 1 tablet (5 mg total) by mouth daily.     Cardiovascular: Calcium Channel Blockers 2 Passed - 12/29/2022  9:05 AM      Passed - Last BP in normal range    BP Readings from Last 1 Encounters:  07/04/22 124/80         Passed - Last Heart Rate in normal range    Pulse Readings from Last 1 Encounters:  07/04/22 71         Passed - Valid encounter within last 6 months    Recent Outpatient Visits           5 months ago Encounter for weight management   [facility] Care at [facility], [place]   10 months ago Essential hypertension   Primary Care at [facility], MD   1 year ago Encounter to establish care   Primary Care at [facility], Kandee Keen, MD   1 year ago Essential hypertension   Primary Care at [facility] [place], [place]

## 2023-01-29 ENCOUNTER — Ambulatory Visit: Payer: Federal, State, Local not specified - PPO | Admitting: Family Medicine

## 2023-01-31 ENCOUNTER — Encounter: Payer: Self-pay | Admitting: Gastroenterology

## 2023-02-14 ENCOUNTER — Encounter: Payer: Self-pay | Admitting: Internal Medicine

## 2023-02-14 ENCOUNTER — Ambulatory Visit (AMBULATORY_SURGERY_CENTER): Payer: Federal, State, Local not specified - PPO

## 2023-02-14 VITALS — Ht 63.0 in | Wt 260.0 lb

## 2023-02-14 DIAGNOSIS — Z1211 Encounter for screening for malignant neoplasm of colon: Secondary | ICD-10-CM

## 2023-02-14 MED ORDER — NA SULFATE-K SULFATE-MG SULF 17.5-3.13-1.6 GM/177ML PO SOLN: 1.0000 | Freq: Once | ORAL | 0 refills | Status: AC

## 2023-02-14 NOTE — Progress Notes (Signed)

## 2023-03-05 ENCOUNTER — Ambulatory Visit: Payer: Federal, State, Local not specified - PPO | Admitting: Family Medicine

## 2023-03-06 ENCOUNTER — Encounter: Payer: Self-pay | Admitting: Gastroenterology

## 2023-03-06 ENCOUNTER — Ambulatory Visit (AMBULATORY_SURGERY_CENTER): Payer: Federal, State, Local not specified - PPO | Admitting: Gastroenterology

## 2023-03-06 VITALS — BP 132/85 | HR 65 | Temp 97.5°F | Resp 10 | Ht 63.0 in | Wt 260.0 lb

## 2023-03-06 DIAGNOSIS — Z1211 Encounter for screening for malignant neoplasm of colon: Secondary | ICD-10-CM

## 2023-03-06 DIAGNOSIS — K634 Enteroptosis: Secondary | ICD-10-CM | POA: Diagnosis not present

## 2023-03-06 DIAGNOSIS — K629 Disease of anus and rectum, unspecified: Secondary | ICD-10-CM

## 2023-03-06 DIAGNOSIS — Z800 Family history of malignant neoplasm of digestive organs: Secondary | ICD-10-CM | POA: Diagnosis not present

## 2023-03-06 MED ORDER — SODIUM CHLORIDE 0.9 % IV SOLN: 500.0000 mL | Freq: Once | INTRAVENOUS | Status: DC

## 2023-03-06 NOTE — Progress Notes (Signed)
Called to room to assist during endoscopic procedure.  Patient ID and intended procedure confirmed with present staff. Received instructions for my participation in the procedure from the performing physician.  

## 2023-03-06 NOTE — Progress Notes (Signed)
Uneventful anesthetic. Report to pacu rn. Vss. Care resumed by rn.

## 2023-03-06 NOTE — Progress Notes (Signed)
[facility] History and Physical   Primary Care Physician:  Dorna Mai, MD   Reason for Procedure:  Family history of colon cancer  Plan:    Screening colonoscopy with possible interventions as needed     HPI: Bethany Rojas is a very pleasant 53 y.o. female here for screening colonoscopy. Denies any nausea, vomiting, abdominal pain, melena or bright red blood per rectum  The risks and benefits as well as alternatives of endoscopic procedure(s) have been discussed and reviewed. All questions answered. The patient agrees to proceed.    Past Medical History:  Diagnosis Date   Advanced maternal age, primigravida in second trimester, antepartum 10/07/2014   GERD (gastroesophageal reflux disease)    Gestational hypertension 10/07/2014   Hypertension    Phreesia 05/05/2021   IUGR (intrauterine growth restriction) 10/07/2014   Medical history non-contributory    Pregnancy induced hypertension 10/07/2014    Past Surgical History:  Procedure Laterality Date   CESAREAN SECTION N/A    [employer]reesia 05/05/2021   NO PAST SURGERIES     TUBAL LIGATION      Prior to Admission medications   Medication Sig Start Date End Date Taking? Authorizing Provider  amLODipine (NORVASC) 5 MG tablet TAKE 1 TABLET (5 MG TOTAL) BY MOUTH DAILY. 01/01/23  Yes Dorna Mai, MD  Multiple Vitamins-Minerals (MULTIVITAMIN GUMMIES ADULT PO) Take by mouth.   Yes [provider]  VITAMIN D3 1.25 MG (50000 UT) capsule Take 50,000 Units by mouth once a week.   Yes [provider]  acetaminophen (TYLENOL) 500 MG tablet Take 500 mg by mouth every 6 (six) hours as needed. Patient not taking: Reported on 02/14/2023    [provider]  phentermine 37.5 MG capsule Take 1 capsule (37.5 mg total) by mouth every morning. Patient not taking: Reported on 07/04/2022 02/28/22   Dorna Mai, MD    Current Outpatient Medications  Medication Sig Dispense Refill   amLODipine (NORVASC) 5  MG tablet TAKE 1 TABLET (5 MG TOTAL) BY MOUTH DAILY. 90 tablet 0   Multiple Vitamins-Minerals (MULTIVITAMIN GUMMIES ADULT PO) Take by mouth.     VITAMIN D3 1.25 MG (50000 UT) capsule Take 50,000 Units by mouth once a week.     acetaminophen (TYLENOL) 500 MG tablet Take 500 mg by mouth every 6 (six) hours as needed. (Patient not taking: Reported on 02/14/2023)     phentermine 37.5 MG capsule Take 1 capsule (37.5 mg total) by mouth every morning. (Patient not taking: Reported on 07/04/2022) 30 capsule 0   Current Facility-Administered Medications  Medication Dose Route Frequency Provider Last Rate Last Admin   0.9 %  sodium chloride infusion  500 mL Intravenous Once Mauri Pole, MD        Allergies as of 03/06/2023   (No Known Allergies)    Family History  Problem Relation Age of Onset   Colon cancer Mother 48   Breast cancer Mother    Cancer Mother    Hypertension Mother    Esophageal cancer Neg Hx    Rectal cancer Neg Hx    Stomach cancer Neg Hx     Social History   Socioeconomic History   Marital status: Married    Spouse name: Not on file   Number of children: Not on file   Years of education: Not on file   Highest education level: Not on file  Occupational History   Not on file  Tobacco Use   Smoking status: Never   Smokeless tobacco:  Never  Vaping Use   Vaping Use: Never used  Substance and Sexual Activity   Alcohol use: No   Drug use: No   Sexual activity: Yes    Birth control/protection: Surgical  Other Topics Concern   Not on file  Social History Narrative   Not on file   Social Determinants of Health   Financial Resource Strain: Not on file  Food Insecurity: Not on file  Transportation Needs: Not on file  Physical Activity: Not on file  Stress: Not on file  Social Connections: Not on file  Intimate Partner Violence: Not on file    Review of Systems:  All other review of systems negative except as mentioned in the HPI.  Physical  Exam: Vital signs in last 24 hours: Blood Pressure 120/70   Pulse 75   Temperature (Abnormal) 97.5 F (36.4 C)   Height '5\' 3"'$  (1.6 m)   Weight 260 lb (117.9 kg)   Oxygen Saturation 97%   Body Mass Index 46.06 kg/m  General:   Alert, NAD Lungs:  Clear .   Heart:  Regular rate and rhythm Abdomen:  Soft, nontender and nondistended. Neuro/Psych:  Alert and cooperative. Normal mood and affect. A and O x 3  Reviewed labs, radiology imaging, old records and pertinent past GI work up  Patient is appropriate for planned procedure(s) and anesthesia in an ambulatory setting   K. Denzil Magnuson , MD [phone]

## 2023-03-06 NOTE — Patient Instructions (Signed)
   Handouts on diverticulosis & hemorrhoids given to you today.   Await pathology results on biopsies done    YOU HAD AN ENDOSCOPIC PROCEDURE TODAY AT [facility]:   Refer to the procedure report that was given to you for any specific questions about what was found during the examination.  If the procedure report does not answer your questions, please call your gastroenterologist to clarify.  If you requested that your care partner not be given the details of your procedure findings, then the procedure report has been included in a sealed envelope for you to review at your convenience later.  YOU SHOULD EXPECT: Some feelings of bloating in the abdomen. Passage of more gas than usual.  Walking can help get rid of the air that was put into your GI tract during the procedure and reduce the bloating. If you had a lower endoscopy (such as a colonoscopy or flexible sigmoidoscopy) you may notice spotting of blood in your stool or on the toilet paper. If you underwent a bowel prep for your procedure, you may not have a normal bowel movement for a few days.  Please Note:  You might notice some irritation and congestion in your nose or some drainage.  This is from the oxygen used during your procedure.  There is no need for concern and it should clear up in a day or so.  SYMPTOMS TO REPORT IMMEDIATELY:  Following lower endoscopy (colonoscopy or flexible sigmoidoscopy):  Excessive amounts of blood in the stool  Significant tenderness or worsening of abdominal pains  Swelling of the abdomen that is new, acute  Fever of 100F or higher   For urgent or emergent issues, a gastroenterologist can be reached at any hour by calling [phone]. Do not use MyChart messaging for urgent concerns.    DIET:  We do recommend a small meal at first, but then you may proceed to your regular diet.  Drink plenty of fluids but you should avoid alcoholic beverages for 24 hours.  ACTIVITY:  You should  plan to take it easy for the rest of today and you should NOT DRIVE or use heavy machinery until tomorrow (because of the sedation medicines used during the test).    FOLLOW UP: Our staff will call the number listed on your records the next business day following your procedure.  We will call around 7:15- 8:00 am to check on you and address any questions or concerns that you may have regarding the information given to you following your procedure. If we do not reach you, we will leave a message.     If any biopsies were taken you will be contacted by phone or by letter within the next 1-3 weeks.  Please call us at [phone] if you have not heard about the biopsies in 3 weeks.    SIGNATURES/CONFIDENTIALITY: You and/or your care partner have signed paperwork which will be entered into your electronic medical record.  These signatures attest to the fact that that the information above on your After Visit Summary has been reviewed and is understood.  Full responsibility of the confidentiality of this discharge information lies with you and/or your care-partner.

## 2023-03-06 NOTE — Progress Notes (Signed)
Cell phone off per pt Pt's states no medical or surgical changes since previsit or office visit.

## 2023-03-06 NOTE — Op Note (Addendum)
[facility] Patient Name: Bethany Rojas Procedure Date: 03/06/2023 11:31 AM MRN: [id number] Endoscopist: Mauri Pole , MD, [id number] Age: 53 Referring MD:  Date of Birth: 16-Jun-1970 Gender: Female Account #: [id number] Procedure:                Colonoscopy Indications:              Screening in patient at increased risk: Colorectal                            cancer in mother 43 or older Medicines:                Monitored Anesthesia Care Procedure:                Pre-Anesthesia Assessment:                           - Prior to the procedure, a History and Physical                            was performed, and patient medications and                            allergies were reviewed. The patient's tolerance of                            previous anesthesia was also reviewed. The risks                            and benefits of the procedure and the sedation                            options and risks were discussed with the patient.                            All questions were answered, and informed consent                            was obtained. Prior Anticoagulants: The patient has                            taken no anticoagulant or antiplatelet agents. ASA                            Grade Assessment: II - A patient with mild systemic                            disease. After reviewing the risks and benefits,                            the patient was deemed in satisfactory condition to                            undergo the procedure.  After obtaining informed consent, the colonoscope                            was passed under direct vision. Throughout the                            procedure, the patient's blood pressure, pulse, and                            oxygen saturations were monitored continuously. The                            [id number]-HQ190L Colonoscope [id number] was introduced                            through the anus and  advanced to the the cecum,                            identified by appendiceal orifice and ileocecal                            valve. The colonoscopy was performed without                            difficulty. The patient tolerated the procedure                            well. The quality of the bowel preparation was                            good. The ileocecal valve, appendiceal orifice, and                            rectum were photographed. Scope In: 11:42:37 AM Scope Out: 11:55:12 AM Scope Withdrawal Time: 0 hours 8 minutes 54 seconds  Total Procedure Duration: 0 hours 12 minutes 35 seconds  Findings:                 The perianal and digital rectal examinations were                            normal.                           Scattered large-mouthed, medium-mouthed and                            small-mouthed diverticula were found in the sigmoid                            colon, descending colon, transverse colon and                            ascending colon.  Non-bleeding external and internal hemorrhoids were                            found during retroflexion. The hemorrhoids were                            small.                           A localized area of mildly nodular mucosa was found                            at the anus. Biopsies were taken with a cold                            forceps for histology to exclude anal intra                            epithelial lesion.                           The exam was otherwise without abnormality. Complications:            No immediate complications. Estimated Blood Loss:     Estimated blood loss was minimal. Impression:               - Moderate diverticulosis in the sigmoid colon, in                            the descending colon, in the transverse colon and                            in the ascending colon.                           - Non-bleeding external and internal hemorrhoids.                            - Nodular mucosa at the anus. Biopsied.                           - The examination was otherwise normal. Recommendation:           - Patient has a contact number available for                            emergencies. The signs and symptoms of potential                            delayed complications were discussed with the                            patient. Return to normal activities tomorrow.                            Written discharge instructions were provided to the  patient.                           - Resume previous diet.                           - Continue present medications.                           - Await pathology results.                           - Repeat colonoscopy in 5 years for surveillance. Mauri Pole, MD 03/06/2023 12:01:06 PM This report has been signed electronically.

## 2023-03-07 ENCOUNTER — Encounter: Payer: Self-pay | Admitting: Family Medicine

## 2023-03-07 ENCOUNTER — Ambulatory Visit: Payer: Federal, State, Local not specified - PPO | Admitting: Family Medicine

## 2023-03-07 ENCOUNTER — Telehealth: Payer: Self-pay

## 2023-03-07 VITALS — BP 128/86 | HR 65 | Temp 97.8°F | Resp 16 | Wt 260.0 lb

## 2023-03-07 DIAGNOSIS — Z7689 Persons encountering health services in other specified circumstances: Secondary | ICD-10-CM

## 2023-03-07 DIAGNOSIS — I10 Essential (primary) hypertension: Secondary | ICD-10-CM

## 2023-03-07 DIAGNOSIS — Z6841 Body Mass Index (BMI) 40.0 and over, adult: Secondary | ICD-10-CM | POA: Diagnosis not present

## 2023-03-07 MED ORDER — WEGOVY 0.25 MG/0.5ML ~~LOC~~ SOAJ: 0.2500 mg | SUBCUTANEOUS | 0 refills | Status: DC

## 2023-03-07 NOTE — Telephone Encounter (Signed)
  Follow up Call-     03/06/2023   11:06 AM  Call back number  Post procedure Call Back phone  # [phone]  Permission to leave phone message Yes     Left message

## 2023-03-07 NOTE — Progress Notes (Signed)
Patient is here for their 6 month follow-up Patient has no concerns today Care gaps have been discussed with patient  

## 2023-03-11 ENCOUNTER — Encounter: Payer: Self-pay | Admitting: Family Medicine

## 2023-03-12 ENCOUNTER — Encounter: Payer: Self-pay | Admitting: Family Medicine

## 2023-03-12 NOTE — Progress Notes (Signed)
Established Patient Office Visit  Subjective    Patient ID: Bethany Rojas, female    DOB: [birth year]  Age: 53 y.o. MRN: [id number]  CC: No chief complaint on file.   HPI Bethany Rojas presents for routine weight management. Patient denies acute complaints.    Outpatient Encounter Medications as of 03/07/2023  Medication Sig   Semaglutide-Weight Management (WEGOVY) 0.25 MG/0.5ML SOAJ Inject 0.25 mg into the skin once a week.   acetaminophen (TYLENOL) 500 MG tablet Take 500 mg by mouth every 6 (six) hours as needed. (Patient not taking: Reported on 02/14/2023)   amLODipine (NORVASC) 5 MG tablet TAKE 1 TABLET (5 MG TOTAL) BY MOUTH DAILY.   Multiple Vitamins-Minerals (MULTIVITAMIN GUMMIES ADULT PO) Take by mouth.   VITAMIN D3 1.25 MG (50000 UT) capsule Take 50,000 Units by mouth once a week.   [DISCONTINUED] phentermine 37.5 MG capsule Take 1 capsule (37.5 mg total) by mouth every morning. (Patient not taking: Reported on 07/04/2022)   No facility-administered encounter medications on file as of 03/07/2023.    Past Medical History:  Diagnosis Date   Advanced maternal age, primigravida in second trimester, antepartum 10/07/2014   GERD (gastroesophageal reflux disease)    Gestational hypertension 10/07/2014   Hypertension    Phreesia 05/05/2021   IUGR (intrauterine growth restriction) 10/07/2014   Medical history non-contributory    Pregnancy induced hypertension 10/07/2014    Past Surgical History:  Procedure Laterality Date   CESAREAN SECTION N/A    Phreesia 05/05/2021   NO PAST SURGERIES     TUBAL LIGATION      Family History  Problem Relation Age of Onset   Colon cancer Mother 67   Breast cancer Mother    Cancer Mother    Hypertension Mother    Esophageal cancer Neg Hx    Rectal cancer Neg Hx    Stomach cancer Neg Hx     Social History   Socioeconomic History   Marital status: Married    Spouse name: Not on file   Number of children: Not on file   Years of  education: Not on file   Highest education level: Not on file  Occupational History   Not on file  Tobacco Use   Smoking status: Never   Smokeless tobacco: Never  Vaping Use   Vaping Use: Never used  Substance and Sexual Activity   Alcohol use: No   Drug use: No   Sexual activity: Yes    Birth control/protection: Surgical  Other Topics Concern   Not on file  Social History Narrative   Not on file   Social Determinants of Health   Financial Resource Strain: Not on file  Food Insecurity: Not on file  Transportation Needs: Not on file  Physical Activity: Not on file  Stress: Not on file  Social Connections: Not on file  Intimate Partner Violence: Not on file    Review of Systems  All other systems reviewed and are negative.       Objective    BP 128/86   Pulse 65   Temp 97.8 F (36.6 C) (Oral)   Resp 16   Wt 260 lb (117.9 kg)   SpO2 94%   BMI 46.06 kg/m   Physical Exam Vitals and nursing note reviewed.  Constitutional:      General: She is not in acute distress.    Appearance: She is obese.  Cardiovascular:     Rate and Rhythm: Normal rate and regular rhythm.  Pulmonary:  Effort: Pulmonary effort is normal.     Breath sounds: Normal breath sounds.  Abdominal:     Palpations: Abdomen is soft.     Tenderness: There is no abdominal tenderness.  Neurological:     General: No focal deficit present.     Mental Status: She is alert and oriented to person, place, and time.         Assessment & Plan:   1. Encounter for weight management Wegovey prescribed.   2. Class 3 severe obesity due to excess calories with serious comorbidity and body mass index (BMI) of 45.0 to 49.9 in adult [facility]) Discussed dietary and activity options.   3. Essential hypertension Appears stable. continue   No follow-ups on file.   Bethany Sax, MD

## 2023-03-14 ENCOUNTER — Encounter: Payer: Self-pay | Admitting: Gastroenterology

## 2023-04-28 ENCOUNTER — Other Ambulatory Visit: Payer: Self-pay | Admitting: Family Medicine

## 2023-04-30 ENCOUNTER — Ambulatory Visit: Payer: Federal, State, Local not specified - PPO | Admitting: Family

## 2023-04-30 ENCOUNTER — Encounter: Payer: Self-pay | Admitting: Family

## 2023-04-30 VITALS — BP 123/88 | HR 66 | Ht 63.0 in | Wt 260.6 lb

## 2023-04-30 DIAGNOSIS — Z6841 Body Mass Index (BMI) 40.0 and over, adult: Secondary | ICD-10-CM | POA: Diagnosis not present

## 2023-04-30 DIAGNOSIS — K5900 Constipation, unspecified: Secondary | ICD-10-CM | POA: Diagnosis not present

## 2023-04-30 DIAGNOSIS — Z7689 Persons encountering health services in other specified circumstances: Secondary | ICD-10-CM

## 2023-04-30 MED ORDER — SEMAGLUTIDE-WEIGHT MANAGEMENT 0.5 MG/0.5ML ~~LOC~~ SOAJ: 0.5000 mg | SUBCUTANEOUS | 0 refills | Status: DC

## 2023-04-30 MED ORDER — POLYETHYLENE GLYCOL 3350 17 G PO PACK
17.0000 g | PACK | Freq: Every day | ORAL | 0 refills | Status: DC | PRN
Start: 2023-04-30 — End: 2024-08-25

## 2023-04-30 NOTE — Progress Notes (Signed)
Patient ID: Bethany Rojas, female    DOB: 03-Jun-1970  MRN: [id number]  CC: Weight Check  Subjective: Bethany Rojas is a 53 y.o. female who presents for weight check.  Her concerns today include:  Reports constipation since beginning Wegovy. Otherwise doing well on the same. She denies red flag symptoms associated with constipation. She plans to return at later date for annual physical exam with her primary care provider Georganna Skeans, MD. No further issues/concerns for discussion today.   Patient Active Problem List   Diagnosis Date Noted   Anemia 01/12/2021   Essential hypertension 01/11/2021   Morbid obesity (HCC) 10/07/2014     Current Outpatient Medications on File Prior to Visit  Medication Sig Dispense Refill   acetaminophen (TYLENOL) 500 MG tablet Take 500 mg by mouth every 6 (six) hours as needed. (Patient not taking: Reported on 02/14/2023)     amLODipine (NORVASC) 5 MG tablet TAKE 1 TABLET (5 MG TOTAL) BY MOUTH DAILY. 90 tablet 0   Multiple Vitamins-Minerals (MULTIVITAMIN GUMMIES ADULT PO) Take by mouth.     VITAMIN D3 1.25 MG (50000 UT) capsule Take 50,000 Units by mouth once a week.     No current facility-administered medications on file prior to visit.    No Known Allergies  Social History   Socioeconomic History   Marital status: Married    Spouse name: Not on file   Number of children: Not on file   Years of education: Not on file   Highest education level: Bachelor's degree (e.g., BA, AB, BS)  Occupational History   Not on file  Tobacco Use   Smoking status: Never   Smokeless tobacco: Never  Vaping Use   Vaping Use: Never used  Substance and Sexual Activity   Alcohol use: No   Drug use: No   Sexual activity: Yes    Birth control/protection: Surgical  Other Topics Concern   Not on file  Social History Narrative   Not on file   Social Determinants of Health   Financial Resource Strain: Low Risk  (04/29/2023)   Overall Financial Resource  Strain (CARDIA)    Difficulty of Paying Living Expenses: Not hard at all  Food Insecurity: No Food Insecurity (04/29/2023)   Hunger Vital Sign    Worried About Running Out of Food in the Last Year: Never true    Ran Out of Food in the Last Year: Never true  Transportation Needs: No Transportation Needs (04/29/2023)   PRAPARE - Administrator, Civil Service (Medical): No    Lack of Transportation (Non-Medical): No  Physical Activity: Insufficiently Active (04/29/2023)   Exercise Vital Sign    Days of Exercise per Week: 2 days    Minutes of Exercise per Session: 30 min  Stress: No Stress Concern Present (04/29/2023)   [employer] of Occupational Health - Occupational Stress Questionnaire    Feeling of Stress : Not at all  Social Connections: Moderately Integrated (04/29/2023)   Social Connection and Isolation Panel [NHANES]    Frequency of Communication with Friends and Family: Once a week    Frequency of Social Gatherings with Friends and Family: Once a week    Attends Religious Services: 1 to 4 times per year    Active Member of [employer] or Organizations: Yes    Attends Banker Meetings: 1 to 4 times per year    Marital Status: Married  Catering manager Violence: Not on file    Family History  Problem  Relation Age of Onset   Colon cancer Mother 1   Breast cancer Mother    Cancer Mother    Hypertension Mother    Esophageal cancer Neg Hx    Rectal cancer Neg Hx    Stomach cancer Neg Hx     Past Surgical History:  Procedure Laterality Date   CESAREAN SECTION N/A    Phreesia 05/05/2021   NO PAST SURGERIES     TUBAL LIGATION      ROS: Review of Systems Negative except as stated above  PHYSICAL EXAM: BP 123/88 (BP Location: Right Arm, Patient Position: Sitting, Cuff Size: Large)   Pulse 66   Ht 5\' 3"  (1.6 m)   Wt 260 lb 9.6 oz (118.2 kg)   SpO2 96%   BMI 46.16 kg/m   Wt Readings from Last 3 Encounters:  04/30/23 260 lb 9.6 oz (118.2 kg)   03/07/23 260 lb (117.9 kg)  03/06/23 260 lb (117.9 kg)    Physical Exam HENT:     Head: Normocephalic and atraumatic.  Eyes:     Extraocular Movements: Extraocular movements intact.     Conjunctiva/sclera: Conjunctivae normal.     Pupils: Pupils are equal, round, and reactive to light.  Cardiovascular:     Rate and Rhythm: Normal rate and regular rhythm.     Pulses: Normal pulses.     Heart sounds: Normal heart sounds.  Pulmonary:     Effort: Pulmonary effort is normal.     Breath sounds: Normal breath sounds.  Musculoskeletal:     Cervical back: Normal range of motion and neck supple.  Neurological:     General: No focal deficit present.     Mental Status: She is alert and oriented to person, place, and time.  Psychiatric:        Mood and Affect: Mood normal.        Behavior: Behavior normal.      ASSESSMENT AND PLAN: 1. Encounter for weight management 2. BMI 45.0-49.9, adult (HCC) - Increase Semaglutide-Weight Management from 0.25 mg weekly to 0.5 mg weekly. - Follow-up with primary provider in 4 weeks or sooner if needed for weight check. - Semaglutide-Weight Management 0.5 MG/0.5ML SOAJ; Inject 0.5 mg into the skin once a week.  Dispense: 2 mL; Refill: 0  3. Constipation, unspecified constipation type - Polyethylene Glycol as prescribed. Counseled on medication adherence.  - Follow-up with primary provider as scheduled.  - polyethylene glycol (MIRALAX) 17 g packet; Take 17 g by mouth daily as needed.  Dispense: 30 each; Refill: 0   Patient was given the opportunity to ask questions.  Patient verbalized understanding of the plan and was able to repeat key elements of the plan. Patient was given clear instructions to go to Emergency Department or return to medical center if symptoms don't improve, worsen, or new problems develop.The patient verbalized understanding.   Requested Prescriptions   Signed Prescriptions Disp Refills   Semaglutide-Weight Management 0.5  MG/0.5ML SOAJ 2 mL 0    Sig: Inject 0.5 mg into the skin once a week.   polyethylene glycol (MIRALAX) 17 g packet 30 each 0    Sig: Take 17 g by mouth daily as needed.    Return in about 4 weeks (around 05/28/2023) for Follow-Up or next available with Georganna Skeans, MD.  Rema Fendt, NP

## 2023-04-30 NOTE — Progress Notes (Signed)
Patient here to follow up on wegovy, patient stated that she has been having problems with some constipation

## 2023-05-03 ENCOUNTER — Ambulatory Visit: Payer: Federal, State, Local not specified - PPO | Admitting: Family

## 2023-05-30 ENCOUNTER — Encounter: Payer: Self-pay | Admitting: Family Medicine

## 2023-05-30 ENCOUNTER — Ambulatory Visit: Payer: Federal, State, Local not specified - PPO | Admitting: Family Medicine

## 2023-05-30 VITALS — BP 123/85 | HR 73 | Temp 98.1°F | Resp 16 | Wt 258.8 lb

## 2023-05-30 DIAGNOSIS — Z7689 Persons encountering health services in other specified circumstances: Secondary | ICD-10-CM

## 2023-05-30 DIAGNOSIS — Z6841 Body Mass Index (BMI) 40.0 and over, adult: Secondary | ICD-10-CM

## 2023-05-30 DIAGNOSIS — I10 Essential (primary) hypertension: Secondary | ICD-10-CM

## 2023-05-30 MED ORDER — WEGOVY 1 MG/0.5ML ~~LOC~~ SOAJ: 1.0000 mg | SUBCUTANEOUS | 0 refills | Status: DC

## 2023-05-30 NOTE — Progress Notes (Signed)
Patient came in for monthly weight check. Patient has no other concerns today  

## 2023-05-31 ENCOUNTER — Encounter: Payer: Self-pay | Admitting: Family Medicine

## 2023-05-31 LAB — BASIC METABOLIC PANEL
BUN/Creatinine Ratio: 14 (ref 9–23)
BUN: 11 mg/dL (ref 6–24)
CO2: 21 mmol/L (ref 20–29)
Calcium: 9.2 mg/dL (ref 8.7–10.2)
Chloride: 104 mmol/L (ref 96–106)
Creatinine, Ser: 0.8 mg/dL (ref 0.57–1.00)
Glucose: 100 mg/dL — ABNORMAL HIGH (ref 70–99)
Potassium: 4.3 mmol/L (ref 3.5–5.2)
Sodium: 139 mmol/L (ref 134–144)
eGFR: 89 mL/min/{1.73_m2} (ref >59)

## 2023-05-31 LAB — LIPID PANEL
Chol/HDL Ratio: 3.1 ratio (ref 0.0–4.4)
Cholesterol, Total: 151 mg/dL (ref 100–199)
HDL: 49 mg/dL (ref >39)
LDL Chol Calc (NIH): 86 mg/dL (ref 0–99)
Triglycerides: 85 mg/dL (ref 0–149)
VLDL Cholesterol Cal: 16 mg/dL (ref 5–40)

## 2023-05-31 NOTE — Progress Notes (Signed)
Established Patient Office Visit  Subjective    Patient ID: Bethany Rojas, female    DOB: 03/27/1970  Age: 53 y.o. MRN: [id number]  CC:  Chief Complaint  Patient presents with   Weight Check    HPI Bethany Rojas presents for routine weight management and follow up of hypertension. Patient denies acute complaints or concerns.    Outpatient Encounter Medications as of 05/30/2023  Medication Sig   Semaglutide-Weight Management (WEGOVY) 1 MG/0.5ML SOAJ Inject 1 mg into the skin once a week.   acetaminophen (TYLENOL) 500 MG tablet Take 500 mg by mouth every 6 (six) hours as needed. (Patient not taking: Reported on 02/14/2023)   amLODipine (NORVASC) 5 MG tablet TAKE 1 TABLET (5 MG TOTAL) BY MOUTH DAILY.   Multiple Vitamins-Minerals (MULTIVITAMIN GUMMIES ADULT PO) Take by mouth.   Na Sulfate-K Sulfate-Mg Sulf 17.5-3.13-1.6 GM/177ML SOLN See admin instructions.   polyethylene glycol (MIRALAX) 17 g packet Take 17 g by mouth daily as needed.   Semaglutide-Weight Management 0.5 MG/0.5ML SOAJ Inject 0.5 mg into the skin once a week.   VITAMIN D3 1.25 MG (50000 UT) capsule Take 50,000 Units by mouth once a week.   No facility-administered encounter medications on file as of 05/30/2023.    Past Medical History:  Diagnosis Date   Advanced maternal age, primigravida in second trimester, antepartum 10/07/2014   GERD (gastroesophageal reflux disease)    Gestational hypertension 10/07/2014   Hypertension    Phreesia 05/05/2021   IUGR (intrauterine growth restriction) 10/07/2014   Medical history non-contributory    Pregnancy induced hypertension 10/07/2014    Past Surgical History:  Procedure Laterality Date   CESAREAN SECTION N/A    Phreesia 05/05/2021   NO PAST SURGERIES     TUBAL LIGATION      Family History  Problem Relation Age of Onset   Colon cancer Mother 52   Breast cancer Mother    Cancer Mother    Hypertension Mother    Esophageal cancer Neg Hx    Rectal cancer Neg  Hx    Stomach cancer Neg Hx     Social History   Socioeconomic History   Marital status: Married    Spouse name: Not on file   Number of children: Not on file   Years of education: Not on file   Highest education level: Bachelor's degree (e.g., BA, AB, BS)  Occupational History   Not on file  Tobacco Use   Smoking status: Never   Smokeless tobacco: Never  Vaping Use   Vaping Use: Never used  Substance and Sexual Activity   Alcohol use: No   Drug use: No   Sexual activity: Yes    Birth control/protection: Surgical  Other Topics Concern   Not on file  Social History Narrative   Not on file   Social Determinants of Health   Financial Resource Strain: Low Risk  (04/29/2023)   Overall Financial Resource Strain (CARDIA)    Difficulty of Paying Living Expenses: Not hard at all  Food Insecurity: No Food Insecurity (04/29/2023)   Hunger Vital Sign    Worried About Running Out of Food in the Last Year: Never true    Ran Out of Food in the Last Year: Never true  Transportation Needs: No Transportation Needs (04/29/2023)   PRAPARE - Administrator, Civil Service (Medical): No    Lack of Transportation (Non-Medical): No  Physical Activity: Insufficiently Active (04/29/2023)   Exercise Vital Sign    Days  of Exercise per Week: 2 days    Minutes of Exercise per Session: 30 min  Stress: No Stress Concern Present (04/29/2023)   [employer] of Occupational Health - Occupational Stress Questionnaire    Feeling of Stress : Not at all  Social Connections: Moderately Integrated (04/29/2023)   Social Connection and Isolation Panel [NHANES]    Frequency of Communication with Friends and Family: Once a week    Frequency of Social Gatherings with Friends and Family: Once a week    Attends Religious Services: 1 to 4 times per year    Active Member of [employer] or Organizations: Yes    Attends Banker Meetings: 1 to 4 times per year    Marital Status: Married  Careers information officer Violence: Not on file    Review of Systems  All other systems reviewed and are negative.       Objective    BP 123/85   Pulse 73   Temp 98.1 F (36.7 C) (Oral)   Resp 16   Wt 258 lb 12.8 oz (117.4 kg)   SpO2 95%   BMI 45.84 kg/m   Physical Exam Vitals and nursing note reviewed.  Constitutional:      General: She is not in acute distress.    Appearance: She is obese.  Cardiovascular:     Rate and Rhythm: Normal rate and regular rhythm.  Pulmonary:     Effort: Pulmonary effort is normal.     Breath sounds: Normal breath sounds.  Abdominal:     Palpations: Abdomen is soft.     Tenderness: There is no abdominal tenderness.  Neurological:     General: No focal deficit present.     Mental Status: She is alert and oriented to person, place, and time.         Assessment & Plan:   1. Essential hypertension Appears stable. Monitoring labs ordered.  - Basic Metabolic Panel - Lipid Panel  2. Encounter for weight management Wegovy prescribed.   3. BMI 45.0-49.9, adult (HCC)     Return in about 4 weeks (around 06/27/2023) for follow up.   Tommie Raymond, MD

## 2023-07-03 ENCOUNTER — Encounter: Payer: Self-pay | Admitting: Family Medicine

## 2023-07-03 ENCOUNTER — Ambulatory Visit: Payer: Federal, State, Local not specified - PPO | Admitting: Family Medicine

## 2023-07-03 VITALS — BP 114/80 | HR 83 | Temp 98.1°F | Resp 16 | Wt 251.0 lb

## 2023-07-03 DIAGNOSIS — Z7689 Persons encountering health services in other specified circumstances: Secondary | ICD-10-CM | POA: Diagnosis not present

## 2023-07-03 DIAGNOSIS — Z6841 Body Mass Index (BMI) 40.0 and over, adult: Secondary | ICD-10-CM

## 2023-07-03 MED ORDER — WEGOVY 1 MG/0.5ML ~~LOC~~ SOAJ: 1.0000 mg | SUBCUTANEOUS | 0 refills | Status: DC

## 2023-07-03 NOTE — Progress Notes (Signed)
Established Patient Office Visit  Subjective    Patient ID: Bethany Rojas, female    DOB: [birth year]  Age: 53 y.o. MRN: [id number]  CC: No chief complaint on file.   HPI Bethany Rojas presents for routine weight management. Patient denies acute complaints or concerns.    Outpatient Encounter Medications as of 07/03/2023  Medication Sig   acetaminophen (TYLENOL) 500 MG tablet Take 500 mg by mouth every 6 (six) hours as needed.   amLODipine (NORVASC) 5 MG tablet TAKE 1 TABLET (5 MG TOTAL) BY MOUTH DAILY.   Multiple Vitamins-Minerals (MULTIVITAMIN GUMMIES ADULT PO) Take by mouth.   Na Sulfate-K Sulfate-Mg Sulf 17.5-3.13-1.6 GM/177ML SOLN See admin instructions.   polyethylene glycol (MIRALAX) 17 g packet Take 17 g by mouth daily as needed.   VITAMIN D3 1.25 MG (50000 UT) capsule Take 50,000 Units by mouth once a week.   [DISCONTINUED] Semaglutide-Weight Management (WEGOVY) 1 MG/0.5ML SOAJ Inject 1 mg into the skin once a week.   [DISCONTINUED] Semaglutide-Weight Management 0.5 MG/0.5ML SOAJ Inject 0.5 mg into the skin once a week.   Semaglutide-Weight Management (WEGOVY) 1 MG/0.5ML SOAJ Inject 1 mg into the skin once a week.   No facility-administered encounter medications on file as of 07/03/2023.    Past Medical History:  Diagnosis Date   Advanced maternal age, primigravida in second trimester, antepartum 10/07/2014   GERD (gastroesophageal reflux disease)    Gestational hypertension 10/07/2014   Hypertension    Phreesia 05/05/2021   IUGR (intrauterine growth restriction) 10/07/2014   Medical history non-contributory    Pregnancy induced hypertension 10/07/2014    Past Surgical History:  Procedure Laterality Date   CESAREAN SECTION N/A    Phreesia 05/05/2021   NO PAST SURGERIES     TUBAL LIGATION      Family History  Problem Relation Age of Onset   Colon cancer Mother 55   Breast cancer Mother    Cancer Mother    Hypertension Mother    Esophageal cancer Neg Hx     Rectal cancer Neg Hx    Stomach cancer Neg Hx     Social History   Socioeconomic History   Marital status: Married    Spouse name: Not on file   Number of children: Not on file   Years of education: Not on file   Highest education level: Bachelor's degree (e.g., BA, AB, BS)  Occupational History   Not on file  Tobacco Use   Smoking status: Never   Smokeless tobacco: Never  Vaping Use   Vaping Use: Never used  Substance and Sexual Activity   Alcohol use: No   Drug use: No   Sexual activity: Yes    Birth control/protection: Surgical  Other Topics Concern   Not on file  Social History Narrative   Not on file   Social Determinants of Health   Financial Resource Strain: Low Risk  (04/29/2023)   Overall Financial Resource Strain (CARDIA)    Difficulty of Paying Living Expenses: Not hard at all  Food Insecurity: No Food Insecurity (04/29/2023)   Hunger Vital Sign    Worried About Running Out of Food in the Last Year: Never true    Ran Out of Food in the Last Year: Never true  Transportation Needs: No Transportation Needs (04/29/2023)   PRAPARE - Administrator, Civil Service (Medical): No    Lack of Transportation (Non-Medical): No  Physical Activity: Insufficiently Active (04/29/2023)   Exercise Vital Sign  Days of Exercise per Week: 2 days    Minutes of Exercise per Session: 30 min  Stress: No Stress Concern Present (04/29/2023)   [employer] of Occupational Health - Occupational Stress Questionnaire    Feeling of Stress : Not at all  Social Connections: Moderately Integrated (04/29/2023)   Social Connection and Isolation Panel [NHANES]    Frequency of Communication with Friends and Family: Once a week    Frequency of Social Gatherings with Friends and Family: Once a week    Attends Religious Services: 1 to 4 times per year    Active Member of [employer] or Organizations: Yes    Attends Banker Meetings: 1 to 4 times per year    Marital Status:  Married  Catering manager Violence: Not on file    Review of Systems  All other systems reviewed and are negative.       Objective    BP 114/80   Pulse 83   Temp 98.1 F (36.7 C) (Oral)   Resp 16   Wt 251 lb (113.9 kg)   SpO2 94%   BMI 44.46 kg/m   Physical Exam Vitals and nursing note reviewed.  Constitutional:      General: She is not in acute distress.    Appearance: She is obese.  Cardiovascular:     Rate and Rhythm: Normal rate and regular rhythm.  Pulmonary:     Effort: Pulmonary effort is normal.     Breath sounds: Normal breath sounds.  Abdominal:     Palpations: Abdomen is soft.     Tenderness: There is no abdominal tenderness.  Neurological:     General: No focal deficit present.     Mental Status: She is alert and oriented to person, place, and time.         Assessment & Plan:   1. Encounter for weight management Doing well. Patient prefers to stay with current dosing of wegovy. Refilled.   2. Class 3 severe obesity due to excess calories with serious comorbidity and body mass index (BMI) of 40.0 to 44.9 in adult [facility])     No follow-ups on file.   Tommie Raymond, MD

## 2023-07-08 ENCOUNTER — Other Ambulatory Visit: Payer: Self-pay | Admitting: Family Medicine

## 2023-07-10 ENCOUNTER — Ambulatory Visit: Payer: Federal, State, Local not specified - PPO | Admitting: Family Medicine

## 2023-07-16 ENCOUNTER — Encounter: Payer: Self-pay | Admitting: Family Medicine

## 2023-07-27 ENCOUNTER — Other Ambulatory Visit: Payer: Self-pay | Admitting: Family Medicine

## 2023-07-29 NOTE — Telephone Encounter (Signed)
Requested Prescriptions  Pending Prescriptions Disp Refills   amLODipine (NORVASC) 5 MG tablet [Pharmacy Med Name: AMLODIPINE BESYLATE 5 MG TAB] 90 tablet 0    Sig: TAKE 1 TABLET (5 MG TOTAL) BY MOUTH DAILY.     Cardiovascular: Calcium Channel Blockers 2 Passed - 07/27/2023  9:39 AM      Passed - Last BP in normal range    BP Readings from Last 1 Encounters:  07/03/23 114/80         Passed - Last Heart Rate in normal range    Pulse Readings from Last 1 Encounters:  07/03/23 83         Passed - Valid encounter within last 6 months    Recent Outpatient Visits           3 weeks ago Encounter for weight management   [facility], MD   2 months ago Essential hypertension   [facility], MD   3 months ago Encounter for weight management   [facility] [place], NP   4 months ago Encounter for weight management   [facility], MD   1 year ago Encounter for weight management    [facility], MD       Future Appointments             In 1 month Georganna Skeans, MD [facility]

## 2023-08-12 ENCOUNTER — Other Ambulatory Visit: Payer: Self-pay | Admitting: Family Medicine

## 2023-08-13 ENCOUNTER — Other Ambulatory Visit: Payer: Self-pay | Admitting: Family Medicine

## 2023-08-13 MED ORDER — WEGOVY 1.7 MG/0.75ML ~~LOC~~ SOAJ: 1.7000 mg | SUBCUTANEOUS | 0 refills | Status: DC

## 2023-08-28 ENCOUNTER — Telehealth: Payer: Federal, State, Local not specified - PPO | Admitting: Family Medicine

## 2023-08-28 DIAGNOSIS — U071 COVID-19: Secondary | ICD-10-CM

## 2023-08-28 MED ORDER — PROMETHAZINE-DM 6.25-15 MG/5ML PO SYRP
5.0000 mL | ORAL_SOLUTION | Freq: Four times a day (QID) | ORAL | 0 refills | Status: DC | PRN
Start: 2023-08-28 — End: 2024-08-25

## 2023-08-28 NOTE — Progress Notes (Signed)
Virtual Visit Consent   Bethany Rojas, you are scheduled for a virtual visit with a [facility] provider today. Just as with appointments in the office, your consent must be obtained to participate. Your consent will be active for this visit and any virtual visit you may have with one of our providers in the next 365 days. If you have a MyChart account, a copy of this consent can be sent to you electronically.  As this is a virtual visit, video technology does not allow for your provider to perform a traditional examination. This may limit your provider's ability to fully assess your condition. If your provider identifies any concerns that need to be evaluated in person or the need to arrange testing (such as labs, EKG, etc.), we will make arrangements to do so. Although advances in technology are sophisticated, we cannot ensure that it will always work on either your end or our end. If the connection with a video visit is poor, the visit may have to be switched to a telephone visit. With either a video or telephone visit, we are not always able to ensure that we have a secure connection.  By engaging in this virtual visit, you consent to the provision of healthcare and authorize for your insurance to be billed (if applicable) for the services provided during this visit. Depending on your insurance coverage, you may receive a charge related to this service.  I need to obtain your verbal consent now. Are you willing to proceed with your visit today? Bethany Rojas has provided verbal consent on 08/28/2023 for a virtual visit (video or telephone). Freddy Finner, NP  Date: 08/28/2023 2:00 PM  Virtual Visit via Video Note   I, Freddy Finner, connected with  Bethany Rojas  ([id number], 05-Oct-1970) on 08/28/23 at  2:00 PM EDT by a video-enabled telemedicine application and verified that I am speaking with the correct person using two identifiers.  Location: Patient: Virtual Visit Location Patient:  Home Provider: Virtual Visit Location Provider: Home Office   I discussed the limitations of evaluation and management by telemedicine and the availability of in person appointments. The patient expressed understanding and agreed to proceed.    History of Present Illness: Bethany Rojas is a 53 y.o. who identifies as a female who was assigned female at birth, and is being seen today for covid  Onset was Sunday thought it was her sinuses with feeling down and congestion.  Associated symptoms are cough- dry and at night, and congestion, sore throat Modifying factors are took allergy medication- zyrtec daily  Denies chest pain, shortness of breath, fevers, chills, ear pain  Exposure to sick contacts- unknown- recent air travel COVID test: took this morning and it was + Vaccines: yes- and one booster   Problems:  Patient Active Problem List   Diagnosis Date Noted   Anemia 01/12/2021   Essential hypertension 01/11/2021   Morbid obesity (HCC) 10/07/2014    Allergies: No Known Allergies Medications:  Current Outpatient Medications:    acetaminophen (TYLENOL) 500 MG tablet, Take 500 mg by mouth every 6 (six) hours as needed., Disp: , Rfl:    amLODipine (NORVASC) 5 MG tablet, TAKE 1 TABLET (5 MG TOTAL) BY MOUTH DAILY., Disp: 90 tablet, Rfl: 0   Multiple Vitamins-Minerals (MULTIVITAMIN GUMMIES ADULT PO), Take by mouth., Disp: , Rfl:    Na Sulfate-K Sulfate-Mg Sulf 17.5-3.13-1.6 GM/177ML SOLN, See admin instructions., Disp: , Rfl:    polyethylene glycol (MIRALAX) 17 g packet, Take 17 g  by mouth daily as needed., Disp: 30 each, Rfl: 0   Semaglutide-Weight Management (WEGOVY) 1 MG/0.5ML SOAJ, Inject 1 mg into the skin once a week., Disp: 2 mL, Rfl: 0   Semaglutide-Weight Management (WEGOVY) 1.7 MG/0.75ML SOAJ, Inject 1.7 mg into the skin once a week., Disp: 3 mL, Rfl: 0   VITAMIN D3 1.25 MG (50000 UT) capsule, Take 50,000 Units by mouth once a week., Disp: , Rfl:    Observations/Objective: Patient is well-developed, well-nourished in no acute distress.  Resting comfortably  at home.  Head is normocephalic, atraumatic.  No labored breathing.  Speech is clear and coherent with logical content.  Patient is alert and oriented at baseline.  Cough and congestion noted   Assessment and Plan:  1. COVID-19  - promethazine-dextromethorphan (PROMETHAZINE-DM) 6.25-15 MG/5ML syrup; Take 5 mLs by mouth 4 (four) times daily as needed for cough.  Dispense: 118 mL; Refill: 0  -given mild symptoms and that she is already feeling better will do symptom management  - Continue OTC symptomatic management of choice - Info for COVID sent on AVS as well - Take prescribed medications as directed - Push fluids - Rest as needed - Discussed return precautions and when to seek in-person evaluation, sent via AVS as well  Reviewed side effects, risks and benefits of medication.    Patient acknowledged agreement and understanding of the plan.   Past Medical, Surgical, Social History, Allergies, and Medications have been Reviewed.    Follow Up Instructions: I discussed the assessment and treatment plan with the patient. The patient was provided an opportunity to ask questions and all were answered. The patient agreed with the plan and demonstrated an understanding of the instructions.  A copy of instructions were sent to the patient via MyChart unless otherwise noted below.     The patient was advised to call back or seek an in-person evaluation if the symptoms worsen or if the condition fails to improve as anticipated.  Time:  I spent 10 minutes with the patient via telehealth technology discussing the above problems/concerns.    Freddy Finner, NP

## 2023-08-28 NOTE — Patient Instructions (Addendum)
Bethany Rojas, thank you for joining Freddy Finner, NP for today's virtual visit.  While this provider is not your primary care provider (PCP), if your PCP is located in our provider database this encounter information will be shared with them immediately following your visit.   A [facility] MyChart account gives you access to today's visit and all your visits, tests, and labs performed at [facility] " click here if you don't have a [facility] MyChart account or go to [email or web address]  Consent: (Patient) Bethany Rojas provided verbal consent for this virtual visit at the beginning of the encounter.  Current Medications:  Current Outpatient Medications:    acetaminophen (TYLENOL) 500 MG tablet, Take 500 mg by mouth every 6 (six) hours as needed., Disp: , Rfl:    amLODipine (NORVASC) 5 MG tablet, TAKE 1 TABLET (5 MG TOTAL) BY MOUTH DAILY., Disp: 90 tablet, Rfl: 0   Multiple Vitamins-Minerals (MULTIVITAMIN GUMMIES ADULT PO), Take by mouth., Disp: , Rfl:    Na Sulfate-K Sulfate-Mg Sulf 17.5-3.13-1.6 GM/177ML SOLN, See admin instructions., Disp: , Rfl:    polyethylene glycol (MIRALAX) 17 g packet, Take 17 g by mouth daily as needed., Disp: 30 each, Rfl: 0   Semaglutide-Weight Management (WEGOVY) 1 MG/0.5ML SOAJ, Inject 1 mg into the skin once a week., Disp: 2 mL, Rfl: 0   Semaglutide-Weight Management (WEGOVY) 1.7 MG/0.75ML SOAJ, Inject 1.7 mg into the skin once a week., Disp: 3 mL, Rfl: 0   VITAMIN D3 1.25 MG (50000 UT) capsule, Take 50,000 Units by mouth once a week., Disp: , Rfl:    Medications ordered in this encounter:  No orders of the defined types were placed in this encounter.    *If you need refills on other medications prior to your next appointment, please contact your pharmacy*  Follow-Up: Call back or seek an in-person evaluation if the symptoms worsen or if the condition fails to improve as anticipated.  [facility] [phone]  Care Instructions:  - Continue OTC symptomatic management of choice  - Take prescribed medications as directed - Push fluids - Rest as needed  Isolation Instructions: You are to isolate at home until you have been fever free for at least 24 hours without a fever-reducing medication, and symptoms have been steadily improving for 24 hours. At that time,  you can end isolation but need to mask for an additional 5 days.   If you must be around other household members who do not have symptoms, you need to make sure that both you and the family members are masking consistently with a high-quality mask.  If you note any worsening of symptoms despite treatment, please seek an in-person evaluation ASAP. If you note any significant shortness of breath or any chest pain, please seek ER evaluation. Please do not delay care!   COVID-19: What to Do if You Are Sick If you test positive and are an older adult or someone who is at high risk of getting very sick from COVID-19, treatment may be available. Contact a healthcare provider right away after a positive test to determine if you are eligible, even if your symptoms are mild right now. You can also visit a Test to Treat location and, if eligible, receive a prescription from a provider. Don't delay: Treatment must be started within the first few days to be effective. If you have a fever, cough, or other symptoms, you might have COVID-19. Most people have mild illness and are able to  recover at home. If you are sick: Keep track of your symptoms. If you have an emergency warning sign (including trouble breathing), call 911. Steps to help prevent the spread of COVID-19 if you are sick If you are sick with COVID-19 or think you might have COVID-19, follow the steps below to care for yourself and to help protect other people in your home and community. Stay home except to get medical care Stay home. Most people with COVID-19 have mild illness and  can recover at home without medical care. Do not leave your home, except to get medical care. Do not visit public areas and do not go to places where you are unable to wear a mask. Take care of yourself. Get rest and stay hydrated. Take over-the-counter medicines, such as acetaminophen, to help you feel better. Stay in touch with your doctor. Call before you get medical care. Be sure to get care if you have trouble breathing, or have any other emergency warning signs, or if you think it is an emergency. Avoid public transportation, ride-sharing, or taxis if possible. Get tested If you have symptoms of COVID-19, get tested. While waiting for test results, stay away from others, including staying apart from those living in your household. Get tested as soon as possible after your symptoms start. Treatments may be available for people with COVID-19 who are at risk for becoming very sick. Don't delay: Treatment must be started early to be effective--some treatments must begin within 5 days of your first symptoms. Contact your healthcare provider right away if your test result is positive to determine if you are eligible. Self-tests are one of several options for testing for the virus that causes COVID-19 and may be more convenient than laboratory-based tests and point-of-care tests. Ask your healthcare provider or your local health department if you need help interpreting your test results. You can visit your state, tribal, local, and territorial health department's website to look for the latest local information on testing sites. Separate yourself from other people As much as possible, stay in a specific room and away from other people and pets in your home. If possible, you should use a separate bathroom. If you need to be around other people or animals in or outside of the home, wear a well-fitting mask. Tell your close contacts that they may have been exposed to COVID-19. An infected person can spread  COVID-19 starting 48 hours (or 2 days) before the person has any symptoms or tests positive. By letting your close contacts know they may have been exposed to COVID-19, you are helping to protect everyone. See COVID-19 and Animals if you have questions about pets. If you are diagnosed with COVID-19, someone from the health department may call you. Answer the call to slow the spread. Monitor your symptoms Symptoms of COVID-19 include fever, cough, or other symptoms. Follow care instructions from your healthcare provider and local health department. Your local health authorities may give instructions on checking your symptoms and reporting information. When to seek emergency medical attention Look for emergency warning signs* for COVID-19. If someone is showing any of these signs, seek emergency medical care immediately: Trouble breathing Persistent pain or pressure in the chest New confusion Inability to wake or stay awake Pale, gray, or blue-colored skin, lips, or nail beds, depending on skin tone *This list is not all possible symptoms. Please call your medical provider for any other symptoms that are severe or concerning to you. Call 911 or call ahead to  your local emergency facility: Notify the operator that you are seeking care for someone who has or may have COVID-19. Call ahead before visiting your doctor Call ahead. Many medical visits for routine care are being postponed or done by phone or telemedicine. If you have a medical appointment that cannot be postponed, call your doctor's office, and tell them you have or may have COVID-19. This will help the office protect themselves and other patients. If you are sick, wear a well-fitting mask You should wear a mask if you must be around other people or animals, including pets (even at home). Wear a mask with the best fit, protection, and comfort for you. You don't need to wear the mask if you are alone. If you can't put on a mask (because of  trouble breathing, for example), cover your coughs and sneezes in some other way. Try to stay at least 6 feet away from other people. This will help protect the people around you. Masks should not be placed on young children under age 57 years, anyone who has trouble breathing, or anyone who is not able to remove the mask without help. Cover your coughs and sneezes Cover your mouth and nose with a tissue when you cough or sneeze. Throw away used tissues in a lined trash can. Immediately wash your hands with soap and water for at least 20 seconds. If soap and water are not available, clean your hands with an alcohol-based hand sanitizer that contains at least 60% alcohol. Clean your hands often Wash your hands often with soap and water for at least 20 seconds. This is especially important after blowing your nose, coughing, or sneezing; going to the bathroom; and before eating or preparing food. Use hand sanitizer if soap and water are not available. Use an alcohol-based hand sanitizer with at least 60% alcohol, covering all surfaces of your hands and rubbing them together until they feel dry. Soap and water are the best option, especially if hands are visibly dirty. Avoid touching your eyes, nose, and mouth with unwashed hands. Handwashing Tips Avoid sharing personal household items Do not share dishes, drinking glasses, cups, eating utensils, towels, or bedding with other people in your home. Wash these items thoroughly after using them with soap and water or put in the dishwasher. Clean surfaces in your home regularly Clean and disinfect high-touch surfaces (for example, doorknobs, tables, handles, light switches, and countertops) in your "sick room" and bathroom. In shared spaces, you should clean and disinfect surfaces and items after each use by the person who is ill. If you are sick and cannot clean, a caregiver or other person should only clean and disinfect the area around you (such as your  bedroom and bathroom) on an as needed basis. Your caregiver/other person should wait as long as possible (at least several hours) and wear a mask before entering, cleaning, and disinfecting shared spaces that you use. Clean and disinfect areas that may have blood, stool, or body fluids on them. Use household cleaners and disinfectants. Clean visible dirty surfaces with household cleaners containing soap or detergent. Then, use a household disinfectant. Use a product from [employer] List N: Disinfectants for Coronavirus (COVID-19). Be sure to follow the instructions on the label to ensure safe and effective use of the product. Many products recommend keeping the surface wet with a disinfectant for a certain period of time (look at "contact time" on the product label). You may also need to wear personal protective equipment, such as gloves,  depending on the directions on the product label. Immediately after disinfecting, wash your hands with soap and water for 20 seconds. For completed guidance on cleaning and disinfecting your home, visit Complete Disinfection Guidance. Take steps to improve ventilation at home Improve ventilation (air flow) at home to help prevent from spreading COVID-19 to other people in your household. Clear out COVID-19 virus particles in the air by opening windows, using air filters, and turning on fans in your home. Use this interactive tool to learn how to improve air flow in your home. When you can be around others after being sick with COVID-19 Deciding when you can be around others is different for different situations. Find out when you can safely end home isolation. For any additional questions about your care, contact your healthcare provider or state or local health department. 03/21/2021 Content source: [facility] for Immunization and Respiratory Diseases ([facility]), Division of Viral Diseases This information is not intended to replace advice given to you by your health  care provider. Make sure you discuss any questions you have with your health care provider. Document Revised: 05/04/2021 Document Reviewed: 05/04/2021 Elsevier Patient Education  2022 [employer].     If you have been instructed to have an in-person evaluation today at a local Urgent Care facility, please use the link below. It will take you to a list of all of our available [facility], including address, phone number and hours of operation. Please do not delay care.  [facility]  If you or a family member do not have a primary care provider, use the link below to schedule a visit and establish care. When you choose a [facility] primary care physician or advanced practice provider, you gain a long-term partner in health. Find a Primary Care Provider  Learn more about [facility] in-office and virtual care options:  - Get Care Now

## 2023-09-03 ENCOUNTER — Ambulatory Visit: Payer: Federal, State, Local not specified - PPO | Admitting: Family Medicine

## 2023-09-18 ENCOUNTER — Ambulatory Visit: Payer: Federal, State, Local not specified - PPO | Admitting: Family Medicine

## 2023-09-18 ENCOUNTER — Encounter: Payer: Self-pay | Admitting: Family Medicine

## 2023-09-18 VITALS — BP 117/83 | HR 89 | Temp 98.1°F | Resp 16 | Wt 247.0 lb

## 2023-09-18 DIAGNOSIS — Z7984 Long term (current) use of oral hypoglycemic drugs: Secondary | ICD-10-CM

## 2023-09-18 DIAGNOSIS — Z6841 Body Mass Index (BMI) 40.0 and over, adult: Secondary | ICD-10-CM | POA: Diagnosis not present

## 2023-09-18 DIAGNOSIS — Z7689 Persons encountering health services in other specified circumstances: Secondary | ICD-10-CM | POA: Diagnosis not present

## 2023-09-18 MED ORDER — WEGOVY 1.7 MG/0.75ML ~~LOC~~ SOAJ: 1.7000 mg | SUBCUTANEOUS | 0 refills | Status: DC

## 2023-09-23 ENCOUNTER — Encounter: Payer: Self-pay | Admitting: Family Medicine

## 2023-09-23 NOTE — Progress Notes (Signed)
Established Patient Office Visit  Subjective    Patient ID: Bethany Rojas, female    DOB: 16-Jan-1970  Age: 53 y.o. MRN: [id number]  CC:  Chief Complaint  Patient presents with   Weight Check    HPI Bethany Rojas presents for weight management. Patient denies acute complaints or concerns.   Outpatient Encounter Medications as of 09/18/2023  Medication Sig   acetaminophen (TYLENOL) 500 MG tablet Take 500 mg by mouth every 6 (six) hours as needed.   amLODipine (NORVASC) 5 MG tablet TAKE 1 TABLET (5 MG TOTAL) BY MOUTH DAILY.   Multiple Vitamins-Minerals (MULTIVITAMIN GUMMIES ADULT PO) Take by mouth.   Na Sulfate-K Sulfate-Mg Sulf 17.5-3.13-1.6 GM/177ML SOLN See admin instructions.   polyethylene glycol (MIRALAX) 17 g packet Take 17 g by mouth daily as needed.   promethazine-dextromethorphan (PROMETHAZINE-DM) 6.25-15 MG/5ML syrup Take 5 mLs by mouth 4 (four) times daily as needed for cough.   Semaglutide-Weight Management (WEGOVY) 1.7 MG/0.75ML SOAJ Inject 1.7 mg into the skin once a week.   VITAMIN D3 1.25 MG (50000 UT) capsule Take 50,000 Units by mouth once a week.   [DISCONTINUED] Semaglutide-Weight Management (WEGOVY) 1 MG/0.5ML SOAJ Inject 1 mg into the skin once a week.   [DISCONTINUED] Semaglutide-Weight Management (WEGOVY) 1.7 MG/0.75ML SOAJ Inject 1.7 mg into the skin once a week.   No facility-administered encounter medications on file as of 09/18/2023.    Past Medical History:  Diagnosis Date   Advanced maternal age, primigravida in second trimester, antepartum 10/07/2014   GERD (gastroesophageal reflux disease)    Gestational hypertension 10/07/2014   Hypertension    Phreesia 05/05/2021   IUGR (intrauterine growth restriction) 10/07/2014   Medical history non-contributory    Pregnancy induced hypertension 10/07/2014    Past Surgical History:  Procedure Laterality Date   CESAREAN SECTION N/A    Phreesia 05/05/2021   NO PAST SURGERIES     TUBAL LIGATION       Family History  Problem Relation Age of Onset   Colon cancer Mother 57   Breast cancer Mother    Cancer Mother    Hypertension Mother    Esophageal cancer Neg Hx    Rectal cancer Neg Hx    Stomach cancer Neg Hx     Social History   Socioeconomic History   Marital status: Married    Spouse name: Not on file   Number of children: Not on file   Years of education: Not on file   Highest education level: Bachelor's degree (e.g., BA, AB, BS)  Occupational History   Not on file  Tobacco Use   Smoking status: Never   Smokeless tobacco: Never  Vaping Use   Vaping status: Never Used  Substance and Sexual Activity   Alcohol use: No   Drug use: No   Sexual activity: Yes    Birth control/protection: Surgical  Other Topics Concern   Not on file  Social History Narrative   Not on file   Social Determinants of Health   Financial Resource Strain: Low Risk  (04/29/2023)   Overall Financial Resource Strain (CARDIA)    Difficulty of Paying Living Expenses: Not hard at all  Food Insecurity: No Food Insecurity (04/29/2023)   Hunger Vital Sign    Worried About Running Out of Food in the Last Year: Never true    Ran Out of Food in the Last Year: Never true  Transportation Needs: No Transportation Needs (04/29/2023)   PRAPARE - Transportation    Lack  of Transportation (Medical): No    Lack of Transportation (Non-Medical): No  Physical Activity: Insufficiently Active (04/29/2023)   Exercise Vital Sign    Days of Exercise per Week: 2 days    Minutes of Exercise per Session: 30 min  Stress: No Stress Concern Present (04/29/2023)   [employer] of Occupational Health - Occupational Stress Questionnaire    Feeling of Stress : Not at all  Social Connections: Moderately Integrated (04/29/2023)   Social Connection and Isolation Panel [NHANES]    Frequency of Communication with Friends and Family: Once a week    Frequency of Social Gatherings with Friends and Family: Once a week     Attends Religious Services: 1 to 4 times per year    Active Member of [employer] or Organizations: Yes    Attends Banker Meetings: 1 to 4 times per year    Marital Status: Married  Catering manager Violence: Not on file    Review of Systems  All other systems reviewed and are negative.       Objective    BP 117/83   Pulse 89   Temp 98.1 F (36.7 C) (Oral)   Resp 16   Wt 247 lb (112 kg)   SpO2 97%   BMI 43.75 kg/m   Physical Exam Vitals and nursing note reviewed.  Constitutional:      General: She is not in acute distress.    Appearance: She is obese.  Cardiovascular:     Rate and Rhythm: Normal rate and regular rhythm.  Pulmonary:     Effort: Pulmonary effort is normal.     Breath sounds: Normal breath sounds.  Abdominal:     Palpations: Abdomen is soft.     Tenderness: There is no abdominal tenderness.  Neurological:     General: No focal deficit present.     Mental Status: She is alert and oriented to person, place, and time.         Assessment & Plan:   Encounter for weight management  Class 3 severe obesity due to excess calories with serious comorbidity and body mass index (BMI) of 40.0 to 44.9 in adult [facility])  Other orders -     ZOXWRU; Inject 1.7 mg into the skin once a week.  Dispense: 3 mL; Refill: 0     Return in about 4 weeks (around 10/16/2023) for follow up.   Tommie Raymond, MD

## 2023-10-29 ENCOUNTER — Other Ambulatory Visit: Payer: Self-pay | Admitting: *Deleted

## 2023-10-29 ENCOUNTER — Encounter: Payer: Self-pay | Admitting: Family Medicine

## 2023-10-29 ENCOUNTER — Ambulatory Visit (INDEPENDENT_AMBULATORY_CARE_PROVIDER_SITE_OTHER): Payer: Federal, State, Local not specified - PPO | Admitting: Family Medicine

## 2023-10-29 ENCOUNTER — Other Ambulatory Visit: Payer: Self-pay | Admitting: Family Medicine

## 2023-10-29 VITALS — BP 118/83 | HR 70 | Temp 98.1°F | Resp 16 | Ht 63.0 in | Wt 242.6 lb

## 2023-10-29 DIAGNOSIS — Z13220 Encounter for screening for lipoid disorders: Secondary | ICD-10-CM | POA: Diagnosis not present

## 2023-10-29 DIAGNOSIS — Z0000 Encounter for general adult medical examination without abnormal findings: Secondary | ICD-10-CM | POA: Diagnosis not present

## 2023-10-29 DIAGNOSIS — Z130 Encounter for screening for diseases of the blood and blood-forming organs and certain disorders involving the immune mechanism: Secondary | ICD-10-CM | POA: Diagnosis not present

## 2023-10-30 ENCOUNTER — Encounter: Payer: Self-pay | Admitting: Family Medicine

## 2023-10-30 LAB — CMP14+EGFR
ALT: 20 [IU]/L (ref 0–32)
AST: 13 [IU]/L (ref 0–40)
Albumin: 4.5 g/dL (ref 3.8–4.9)
Alkaline Phosphatase: 78 [IU]/L (ref 44–121)
BUN/Creatinine Ratio: 12 (ref 9–23)
BUN: 10 mg/dL (ref 6–24)
Bilirubin Total: 0.3 mg/dL (ref 0.0–1.2)
CO2: 21 mmol/L (ref 20–29)
Calcium: 9.8 mg/dL (ref 8.7–10.2)
Chloride: 105 mmol/L (ref 96–106)
Creatinine, Ser: 0.82 mg/dL (ref 0.57–1.00)
Globulin, Total: 2.8 g/dL (ref 1.5–4.5)
Glucose: 97 mg/dL (ref 70–99)
Potassium: 4.4 mmol/L (ref 3.5–5.2)
Sodium: 140 mmol/L (ref 134–144)
Total Protein: 7.3 g/dL (ref 6.0–8.5)
eGFR: 87 mL/min/{1.73_m2} (ref >59)

## 2023-10-30 LAB — CBC WITH DIFFERENTIAL/PLATELET
Basophils Absolute: 0 10*3/uL (ref 0.0–0.2)
Basos: 1 %
EOS (ABSOLUTE): 0.1 10*3/uL (ref 0.0–0.4)
Eos: 1 %
Hematocrit: 43.2 % (ref 34.0–46.6)
Hemoglobin: 13.4 g/dL (ref 11.1–15.9)
Immature Grans (Abs): 0 10*3/uL (ref 0.0–0.1)
Immature Granulocytes: 0 %
Lymphocytes Absolute: 1.7 10*3/uL (ref 0.7–3.1)
Lymphs: 41 %
MCH: 25.4 pg — ABNORMAL LOW (ref 26.6–33.0)
MCHC: 31 g/dL — ABNORMAL LOW (ref 31.5–35.7)
MCV: 82 fL (ref 79–97)
Monocytes Absolute: 0.3 10*3/uL (ref 0.1–0.9)
Monocytes: 8 %
Neutrophils Absolute: 2 10*3/uL (ref 1.4–7.0)
Neutrophils: 49 %
Platelets: 323 10*3/uL (ref 150–450)
RBC: 5.28 x10E6/uL (ref 3.77–5.28)
RDW: 16.2 % — ABNORMAL HIGH (ref 11.7–15.4)
WBC: 4.2 10*3/uL (ref 3.4–10.8)

## 2023-10-30 LAB — LIPID PANEL
Chol/HDL Ratio: 3.3 ratio (ref 0.0–4.4)
Cholesterol, Total: 178 mg/dL (ref 100–199)
HDL: 54 mg/dL (ref >39)
LDL Chol Calc (NIH): 107 mg/dL — ABNORMAL HIGH (ref 0–99)
Triglycerides: 93 mg/dL (ref 0–149)
VLDL Cholesterol Cal: 17 mg/dL (ref 5–40)

## 2023-10-30 NOTE — Telephone Encounter (Signed)
This patient is waiting on refill of Wegovy. Patient was in office on 10/29/2023

## 2023-10-30 NOTE — Progress Notes (Signed)
Established Patient Office Visit  Subjective    Patient ID: Bethany Rojas, female    DOB: December 27, 1970  Age: 53 y.o. MRN: [id number]  CC:  Chief Complaint  Patient presents with   Annual Exam    HPI Zyion Remer presents for routine annual exam. Patient denies acute complaints.   Outpatient Encounter Medications as of 10/29/2023  Medication Sig   acetaminophen (TYLENOL) 500 MG tablet Take 500 mg by mouth every 6 (six) hours as needed.   amLODipine (NORVASC) 5 MG tablet TAKE 1 TABLET (5 MG TOTAL) BY MOUTH DAILY.   Multiple Vitamins-Minerals (MULTIVITAMIN GUMMIES ADULT PO) Take by mouth.   Na Sulfate-K Sulfate-Mg Sulf 17.5-3.13-1.6 GM/177ML SOLN See admin instructions.   polyethylene glycol (MIRALAX) 17 g packet Take 17 g by mouth daily as needed.   promethazine-dextromethorphan (PROMETHAZINE-DM) 6.25-15 MG/5ML syrup Take 5 mLs by mouth 4 (four) times daily as needed for cough.   Semaglutide-Weight Management (WEGOVY) 1.7 MG/0.75ML SOAJ Inject 1.7 mg into the skin once a week.   VITAMIN D3 1.25 MG (50000 UT) capsule Take 50,000 Units by mouth once a week.   No facility-administered encounter medications on file as of 10/29/2023.    Past Medical History:  Diagnosis Date   Advanced maternal age, primigravida in second trimester, antepartum 10/07/2014   GERD (gastroesophageal reflux disease)    Gestational hypertension 10/07/2014   Hypertension    Phreesia 05/05/2021   IUGR (intrauterine growth restriction) 10/07/2014   Medical history non-contributory    Pregnancy induced hypertension 10/07/2014    Past Surgical History:  Procedure Laterality Date   CESAREAN SECTION N/A    Phreesia 05/05/2021   NO PAST SURGERIES     TUBAL LIGATION      Family History  Problem Relation Age of Onset   Colon cancer Mother 49   Breast cancer Mother    Cancer Mother    Hypertension Mother    Esophageal cancer Neg Hx    Rectal cancer Neg Hx    Stomach cancer Neg Hx     Social  History   Socioeconomic History   Marital status: Married    Spouse name: Not on file   Number of children: Not on file   Years of education: Not on file   Highest education level: Bachelor's degree (e.g., BA, AB, BS)  Occupational History   Not on file  Tobacco Use   Smoking status: Never   Smokeless tobacco: Never  Vaping Use   Vaping status: Never Used  Substance and Sexual Activity   Alcohol use: No   Drug use: No   Sexual activity: Yes    Birth control/protection: Surgical  Other Topics Concern   Not on file  Social History Narrative   Not on file   Social Determinants of Health   Financial Resource Strain: Low Risk  (04/29/2023)   Overall Financial Resource Strain (CARDIA)    Difficulty of Paying Living Expenses: Not hard at all  Food Insecurity: No Food Insecurity (04/29/2023)   Hunger Vital Sign    Worried About Running Out of Food in the Last Year: Never true    Ran Out of Food in the Last Year: Never true  Transportation Needs: No Transportation Needs (04/29/2023)   PRAPARE - Administrator, Civil Service (Medical): No    Lack of Transportation (Non-Medical): No  Physical Activity: Insufficiently Active (04/29/2023)   Exercise Vital Sign    Days of Exercise per Week: 2 days    Minutes  of Exercise per Session: 30 min  Stress: No Stress Concern Present (04/29/2023)   [employer] of Occupational Health - Occupational Stress Questionnaire    Feeling of Stress : Not at all  Social Connections: Moderately Integrated (04/29/2023)   Social Connection and Isolation Panel [NHANES]    Frequency of Communication with Friends and Family: Once a week    Frequency of Social Gatherings with Friends and Family: Once a week    Attends Religious Services: 1 to 4 times per year    Active Member of [employer] or Organizations: Yes    Attends Banker Meetings: 1 to 4 times per year    Marital Status: Married  Catering manager Violence: Not on file     Review of Systems  All other systems reviewed and are negative.       Objective    BP 118/83   Pulse 70   Temp 98.1 F (36.7 C) (Oral)   Resp 16   Ht 5\' 3"  (1.6 m)   Wt 242 lb 9.6 oz (110 kg)   SpO2 93%   BMI 42.97 kg/m   Physical Exam Vitals and nursing note reviewed.  Constitutional:      General: She is not in acute distress.    Appearance: She is obese.  HENT:     Head: Normocephalic and atraumatic.     Right Ear: Tympanic membrane, ear canal and external ear normal.     Left Ear: Tympanic membrane, ear canal and external ear normal.     Nose: Nose normal.     Mouth/Throat:     Mouth: Mucous membranes are moist.     Pharynx: Oropharynx is clear.  Eyes:     Conjunctiva/sclera: Conjunctivae normal.     Pupils: Pupils are equal, round, and reactive to light.  Neck:     Thyroid: No thyromegaly.  Cardiovascular:     Rate and Rhythm: Normal rate and regular rhythm.     Heart sounds: Normal heart sounds. No murmur heard. Pulmonary:     Effort: Pulmonary effort is normal. No respiratory distress.     Breath sounds: Normal breath sounds.  Abdominal:     General: There is no distension.     Palpations: Abdomen is soft. There is no mass.     Tenderness: There is no abdominal tenderness.  Musculoskeletal:        General: Normal range of motion.     Cervical back: Normal range of motion and neck supple.  Skin:    General: Skin is warm and dry.  Neurological:     General: No focal deficit present.     Mental Status: She is alert and oriented to person, place, and time.  Psychiatric:        Mood and Affect: Mood normal.        Behavior: Behavior normal.         Assessment & Plan:   Annual physical exam -     CMP14+EGFR  Screening for lipid disorders -     CBC with Differential/Platelet  Screening for deficiency anemia -     Lipid panel     Return if symptoms worsen or fail to improve.   Tommie Raymond, MD

## 2023-10-31 MED ORDER — WEGOVY 1.7 MG/0.75ML ~~LOC~~ SOAJ: 1.7000 mg | SUBCUTANEOUS | 0 refills | Status: DC

## 2023-11-01 ENCOUNTER — Other Ambulatory Visit: Payer: Self-pay | Admitting: Family Medicine

## 2023-11-01 ENCOUNTER — Other Ambulatory Visit: Payer: Self-pay

## 2023-11-01 MED ORDER — WEGOVY 1.7 MG/0.75ML ~~LOC~~ SOAJ: 1.7000 mg | SUBCUTANEOUS | 0 refills | Status: DC

## 2023-11-03 ENCOUNTER — Other Ambulatory Visit: Payer: Self-pay | Admitting: Family Medicine

## 2023-11-05 ENCOUNTER — Other Ambulatory Visit: Payer: Self-pay

## 2023-11-07 ENCOUNTER — Other Ambulatory Visit: Payer: Self-pay

## 2023-12-04 ENCOUNTER — Ambulatory Visit: Payer: Federal, State, Local not specified - PPO | Admitting: Family Medicine

## 2023-12-30 ENCOUNTER — Other Ambulatory Visit: Payer: Self-pay | Admitting: Family Medicine

## 2024-01-13 ENCOUNTER — Encounter: Payer: Self-pay | Admitting: Family Medicine

## 2024-01-13 ENCOUNTER — Ambulatory Visit (INDEPENDENT_AMBULATORY_CARE_PROVIDER_SITE_OTHER): Payer: 59 | Admitting: Family Medicine

## 2024-01-13 VITALS — BP 133/84 | HR 85 | Wt 245.6 lb

## 2024-01-13 DIAGNOSIS — E66813 Obesity, class 3: Secondary | ICD-10-CM | POA: Diagnosis not present

## 2024-01-13 DIAGNOSIS — Z6841 Body Mass Index (BMI) 40.0 and over, adult: Secondary | ICD-10-CM

## 2024-01-13 DIAGNOSIS — Z7689 Persons encountering health services in other specified circumstances: Secondary | ICD-10-CM | POA: Diagnosis not present

## 2024-01-13 MED ORDER — WEGOVY 1.7 MG/0.75ML ~~LOC~~ SOAJ: 1.7000 mg | SUBCUTANEOUS | 0 refills | Status: DC

## 2024-01-13 NOTE — Progress Notes (Signed)
 Established Patient Office Visit  Subjective    Patient ID: Bethany Rojas, female    DOB: 30-Oct-1970  Age: 54 y.o. MRN: [id number]  CC:  Chief Complaint  Patient presents with   Weight Management Screening    HPI Bethany Rojas presents for routine weight management. Patient has not been on meds for about 3 months. She restarted with meds at home. She denies acute complaints.   Outpatient Encounter Medications as of 01/13/2024  Medication Sig   acetaminophen  (TYLENOL ) 500 MG tablet Take 500 mg by mouth every 6 (six) hours as needed.   amLODipine  (NORVASC ) 5 MG tablet TAKE 1 TABLET (5 MG TOTAL) BY MOUTH DAILY.   Multiple Vitamins-Minerals (MULTIVITAMIN GUMMIES ADULT PO) Take by mouth.   Na Sulfate-K Sulfate-Mg Sulf 17.5-3.13-1.6 GM/177ML SOLN See admin instructions.   polyethylene glycol (MIRALAX ) 17 g packet Take 17 g by mouth daily as needed.   promethazine -dextromethorphan (PROMETHAZINE -DM) 6.25-15 MG/5ML syrup Take 5 mLs by mouth 4 (four) times daily as needed for cough.   VITAMIN D3 1.25 MG (50000 UT) capsule Take 50,000 Units by mouth once a week.   [DISCONTINUED] Semaglutide -Weight Management (WEGOVY ) 1.7 MG/0.75ML SOAJ Inject 1.7 mg into the skin once a week.   Semaglutide -Weight Management (WEGOVY ) 1.7 MG/0.75ML SOAJ Inject 1.7 mg into the skin once a week.   No facility-administered encounter medications on file as of 01/13/2024.    Past Medical History:  Diagnosis Date   Advanced maternal age, primigravida in second trimester, antepartum 10/07/2014   GERD (gastroesophageal reflux disease)    Gestational hypertension 10/07/2014   Hypertension    Phreesia 05/05/2021   IUGR (intrauterine growth restriction) 10/07/2014   Medical history non-contributory    Pregnancy induced hypertension 10/07/2014    Past Surgical History:  Procedure Laterality Date   CESAREAN SECTION N/A    Phreesia 05/05/2021   NO PAST SURGERIES     TUBAL LIGATION      Family History   Problem Relation Age of Onset   Colon cancer Mother 45   Breast cancer Mother    Cancer Mother    Hypertension Mother    Esophageal cancer Neg Hx    Rectal cancer Neg Hx    Stomach cancer Neg Hx     Social History   Socioeconomic History   Marital status: Married    Spouse name: Not on file   Number of children: Not on file   Years of education: Not on file   Highest education level: Bachelor's degree (e.g., BA, AB, BS)  Occupational History   Not on file  Tobacco Use   Smoking status: Never   Smokeless tobacco: Never  Vaping Use   Vaping status: Never Used  Substance and Sexual Activity   Alcohol use: No   Drug use: No   Sexual activity: Yes    Birth control/protection: Surgical  Other Topics Concern   Not on file  Social History Narrative   Not on file   Social Drivers of Health   Financial Resource Strain: Low Risk  (04/29/2023)   Overall Financial Resource Strain (CARDIA)    Difficulty of Paying Living Expenses: Not hard at all  Food Insecurity: No Food Insecurity (04/29/2023)   Hunger Vital Sign    Worried About Running Out of Food in the Last Year: Never true    Ran Out of Food in the Last Year: Never true  Transportation Needs: No Transportation Needs (04/29/2023)   PRAPARE - Transportation    Lack of Transportation (  Medical): No    Lack of Transportation (Non-Medical): No  Physical Activity: Insufficiently Active (04/29/2023)   Exercise Vital Sign    Days of Exercise per Week: 2 days    Minutes of Exercise per Session: 30 min  Stress: No Stress Concern Present (04/29/2023)   [employer] of Occupational Health - Occupational Stress Questionnaire    Feeling of Stress : Not at all  Social Connections: Moderately Integrated (04/29/2023)   Social Connection and Isolation Panel [NHANES]    Frequency of Communication with Friends and Family: Once a week    Frequency of Social Gatherings with Friends and Family: Once a week    Attends Religious Services: 1  to 4 times per year    Active Member of [employer] or Organizations: Yes    Attends Banker Meetings: 1 to 4 times per year    Marital Status: Married  Catering Manager Violence: Not on file    Review of Systems  All other systems reviewed and are negative.       Objective    BP 133/84 (BP Location: Right Arm, Patient Position: Sitting, Cuff Size: Large)   Pulse 85   Wt 245 lb 9.6 oz (111.4 kg)   SpO2 96%   BMI 43.51 kg/m   Physical Exam Vitals and nursing note reviewed.  Constitutional:      General: She is not in acute distress.    Appearance: She is obese.  Cardiovascular:     Rate and Rhythm: Normal rate and regular rhythm.  Pulmonary:     Effort: Pulmonary effort is normal.     Breath sounds: Normal breath sounds.  Abdominal:     Palpations: Abdomen is soft.     Tenderness: There is no abdominal tenderness.  Neurological:     General: No focal deficit present.     Mental Status: She is alert and oriented to person, place, and time.         Assessment & Plan:   Encounter for weight management  Class 3 severe obesity due to excess calories with serious comorbidity and body mass index (BMI) of 40.0 to 44.9 in adult [facility]  Other orders -     Wegovy ; Inject 1.7 mg into the skin once a week.  Dispense: 9 mL; Refill: 0     Return in about 6 weeks (around 02/24/2024) for follow up, weight management.   Tanda Raguel SQUIBB, MD

## 2024-01-15 ENCOUNTER — Encounter: Payer: Self-pay | Admitting: Family Medicine

## 2024-01-17 ENCOUNTER — Other Ambulatory Visit: Payer: Self-pay | Admitting: Family Medicine

## 2024-01-21 ENCOUNTER — Telehealth: Payer: Self-pay | Admitting: Family Medicine

## 2024-01-21 NOTE — Telephone Encounter (Signed)
Copied from CRM [id number]. Topic: General - Other >> Jan 21, 2024  3:22 PM Bethany Rojas wrote: Reason for CRM: The patient has been directed by their pharmacy to contact their PCP and request that prior authorization for a 1 month supply of their prescription for Semaglutide-Weight Management (WEGOVY) 1.7 MG/0.75ML SOAJ [[id number]] be submitted   Please contact the patient further when possible

## 2024-01-24 ENCOUNTER — Telehealth: Payer: Self-pay | Admitting: Emergency Medicine

## 2024-01-24 ENCOUNTER — Other Ambulatory Visit: Payer: Self-pay

## 2024-01-24 NOTE — Telephone Encounter (Signed)
I called patient and made her aware that MD Andrey Campanile was already gone for today and I will communicate with her on Monday to [facility]

## 2024-01-24 NOTE — Telephone Encounter (Signed)
Pharmacy Patient Advocate Encounter  Received notification from [facility] that Prior Authorization for [facility] has been APPROVED from 01/24/2024 to 01/23/2025   PA #/Case ID/Reference #: [id number]

## 2024-01-27 ENCOUNTER — Other Ambulatory Visit: Payer: Self-pay | Admitting: Family Medicine

## 2024-01-27 MED ORDER — WEGOVY 1.7 MG/0.75ML ~~LOC~~ SOAJ: 1.7000 mg | SUBCUTANEOUS | 0 refills | Status: DC

## 2024-01-29 ENCOUNTER — Other Ambulatory Visit: Payer: Self-pay

## 2024-03-03 ENCOUNTER — Encounter: Payer: Self-pay | Admitting: Family Medicine

## 2024-03-03 ENCOUNTER — Ambulatory Visit: Payer: 59 | Admitting: Family Medicine

## 2024-03-03 VITALS — BP 135/89 | HR 75 | Temp 97.9°F | Resp 16 | Ht 63.0 in | Wt 244.4 lb

## 2024-03-03 DIAGNOSIS — Z6841 Body Mass Index (BMI) 40.0 and over, adult: Secondary | ICD-10-CM | POA: Diagnosis not present

## 2024-03-03 DIAGNOSIS — E66813 Obesity, class 3: Secondary | ICD-10-CM

## 2024-03-03 DIAGNOSIS — Z7689 Persons encountering health services in other specified circumstances: Secondary | ICD-10-CM

## 2024-03-03 NOTE — Progress Notes (Signed)
 Bethany Rojas

## 2024-03-03 NOTE — Progress Notes (Signed)
 Established Patient Office Visit  Subjective    Patient ID: Bethany Rojas, female    DOB: 26-Apr-1970  Age: 54 y.o. MRN: [id number]  CC:  Chief Complaint  Patient presents with   Weight Management Screening    HPI Bethany Rojas presents for routine weight management. Patient reports med compliance.   Outpatient Encounter Medications as of 03/03/2024  Medication Sig   acetaminophen (TYLENOL) 500 MG tablet Take 500 mg by mouth every 6 (six) hours as needed.   amLODipine (NORVASC) 5 MG tablet TAKE 1 TABLET (5 MG TOTAL) BY MOUTH DAILY.   Multiple Vitamins-Minerals (MULTIVITAMIN GUMMIES ADULT PO) Take by mouth.   Na Sulfate-K Sulfate-Mg Sulf 17.5-3.13-1.6 GM/177ML SOLN See admin instructions.   polyethylene glycol (MIRALAX) 17 g packet Take 17 g by mouth daily as needed.   promethazine-dextromethorphan (PROMETHAZINE-DM) 6.25-15 MG/5ML syrup Take 5 mLs by mouth 4 (four) times daily as needed for cough.   Semaglutide-Weight Management (WEGOVY) 1.7 MG/0.75ML SOAJ Inject 1.7 mg into the skin once a week.   VITAMIN D3 1.25 MG (50000 UT) capsule Take 50,000 Units by mouth once a week.   No facility-administered encounter medications on file as of 03/03/2024.    Past Medical History:  Diagnosis Date   Advanced maternal age, primigravida in second trimester, antepartum 10/07/2014   GERD (gastroesophageal reflux disease)    Gestational hypertension 10/07/2014   Hypertension    Phreesia 05/05/2021   IUGR (intrauterine growth restriction) 10/07/2014   Medical history non-contributory    Pregnancy induced hypertension 10/07/2014    Past Surgical History:  Procedure Laterality Date   CESAREAN SECTION N/A    Phreesia 05/05/2021   NO PAST SURGERIES     TUBAL LIGATION      Family History  Problem Relation Age of Onset   Colon cancer Mother 32   Breast cancer Mother    Cancer Mother    Hypertension Mother    Esophageal cancer Neg Hx    Rectal cancer Neg Hx    Stomach cancer Neg Hx      Social History   Socioeconomic History   Marital status: Married    Spouse name: Not on file   Number of children: Not on file   Years of education: Not on file   Highest education level: Bachelor's degree (e.g., BA, AB, BS)  Occupational History   Not on file  Tobacco Use   Smoking status: Never   Smokeless tobacco: Never  Vaping Use   Vaping status: Never Used  Substance and Sexual Activity   Alcohol use: No   Drug use: No   Sexual activity: Yes    Birth control/protection: Surgical  Other Topics Concern   Not on file  Social History Narrative   Not on file   Social Drivers of Health   Financial Resource Strain: Low Risk  (04/29/2023)   Overall Financial Resource Strain (CARDIA)    Difficulty of Paying Living Expenses: Not hard at all  Food Insecurity: No Food Insecurity (04/29/2023)   Hunger Vital Sign    Worried About Running Out of Food in the Last Year: Never true    Ran Out of Food in the Last Year: Never true  Transportation Needs: No Transportation Needs (04/29/2023)   PRAPARE - Administrator, Civil Service (Medical): No    Lack of Transportation (Non-Medical): No  Physical Activity: Insufficiently Active (04/29/2023)   Exercise Vital Sign    Days of Exercise per Week: 2 days  Minutes of Exercise per Session: 30 min  Stress: No Stress Concern Present (04/29/2023)   [employer] of Occupational Health - Occupational Stress Questionnaire    Feeling of Stress : Not at all  Social Connections: Moderately Integrated (04/29/2023)   Social Connection and Isolation Panel [NHANES]    Frequency of Communication with Friends and Family: Once a week    Frequency of Social Gatherings with Friends and Family: Once a week    Attends Religious Services: 1 to 4 times per year    Active Member of [employer] or Organizations: Yes    Attends Banker Meetings: 1 to 4 times per year    Marital Status: Married  Catering manager Violence: Not on file     Review of Systems  All other systems reviewed and are negative.       Objective    BP 135/89   Pulse 75   Temp 97.9 F (36.6 C) (Temporal)   Resp 16   Ht 5\' 3"  (1.6 m)   Wt 244 lb 6.4 oz (110.9 kg)   SpO2 93%   BMI 43.29 kg/m   Physical Exam Vitals and nursing note reviewed.  Constitutional:      General: She is not in acute distress.    Appearance: She is obese.  Cardiovascular:     Rate and Rhythm: Normal rate and regular rhythm.  Pulmonary:     Effort: Pulmonary effort is normal.     Breath sounds: Normal breath sounds.  Abdominal:     Palpations: Abdomen is soft.     Tenderness: There is no abdominal tenderness.  Neurological:     General: No focal deficit present.     Mental Status: She is alert and oriented to person, place, and time.         Assessment & Plan:   Encounter for weight management  Class 3 severe obesity due to excess calories with serious comorbidity and body mass index (BMI) of 40.0 to 44.9 in adult [facility])   Patient has 3 month supply from mail order pharmacy  Return in about 2 months (around 05/03/2024) for follow up, weight management.   Tommie Raymond, MD

## 2024-04-20 ENCOUNTER — Other Ambulatory Visit: Payer: Self-pay | Admitting: Family Medicine

## 2024-04-22 ENCOUNTER — Encounter: Payer: Self-pay | Admitting: Family Medicine

## 2024-04-22 ENCOUNTER — Other Ambulatory Visit: Payer: Self-pay | Admitting: Family Medicine

## 2024-04-22 MED ORDER — WEGOVY 2.4 MG/0.75ML ~~LOC~~ SOAJ: 2.4000 mg | SUBCUTANEOUS | 0 refills | Status: DC

## 2024-04-25 ENCOUNTER — Other Ambulatory Visit: Payer: Self-pay | Admitting: Family Medicine

## 2024-05-04 ENCOUNTER — Ambulatory Visit: Admitting: Family Medicine

## 2024-05-23 ENCOUNTER — Other Ambulatory Visit: Payer: Self-pay | Admitting: Family Medicine

## 2024-06-02 ENCOUNTER — Ambulatory Visit: Admitting: Physician Assistant

## 2024-06-02 VITALS — BP 134/88 | HR 73 | Temp 98.2°F | Resp 16 | Ht 63.0 in | Wt 248.8 lb

## 2024-06-02 DIAGNOSIS — Z6841 Body Mass Index (BMI) 40.0 and over, adult: Secondary | ICD-10-CM

## 2024-06-02 DIAGNOSIS — E66813 Obesity, class 3: Secondary | ICD-10-CM

## 2024-06-02 MED ORDER — TIRZEPATIDE-WEIGHT MANAGEMENT 2.5 MG/0.5ML ~~LOC~~ SOLN: 2.5000 mg | SUBCUTANEOUS | 0 refills | Status: DC

## 2024-06-02 MED ORDER — TIRZEPATIDE-WEIGHT MANAGEMENT 5 MG/0.5ML ~~LOC~~ SOLN
5.0000 mg | SUBCUTANEOUS | 1 refills | Status: DC
Start: 2024-07-02 — End: 2024-08-07

## 2024-06-02 NOTE — Progress Notes (Signed)
Patient came in for monthly weight check. Patient has no other concerns today

## 2024-06-02 NOTE — Patient Instructions (Signed)
Work at a goal of eliminating sugary drinks, candy, desserts, sweets, refined sugars, processed foods, and white carbohydrates.    Drink 64 to 80 ounces water daily

## 2024-06-02 NOTE — Progress Notes (Signed)
 Patient ID: Bethany Rojas, female   DOB: 1970/04/24, 54 y.o.   MRN: [id number]   Bethany Rojas, is a 54 y.o. female  WUJ:[id number]  [id number]  DOB - 1970-08-18  Chief Complaint  Patient presents with   Weight Check       Subjective:   Bethany Rojas is a 54 y.o. female here today for a follow up visit for being on wegovy .  She feels she is at a stand still for weight loss.  Initially she says wegovy  helped with appetite but she has not lost any weight since last visit.  Most recent dose of wegovy  was 2 days ago.  No muslcle cramping/hair loss.  She has not been exercising regularly but is trying to eat better.   No problems updated.  ALLERGIES: No Known Allergies  PAST MEDICAL HISTORY: Past Medical History:  Diagnosis Date   Advanced maternal age, primigravida in second trimester, antepartum 10/07/2014   GERD (gastroesophageal reflux disease)    Gestational hypertension 10/07/2014   Hypertension    Phreesia 05/05/2021   IUGR (intrauterine growth restriction) 10/07/2014   Medical history non-contributory    Pregnancy induced hypertension 10/07/2014    MEDICATIONS AT HOME: Prior to Admission medications   Medication Sig Start Date End Date Taking? Authorizing Provider  acetaminophen  (TYLENOL ) 500 MG tablet Take 500 mg by mouth every 6 (six) hours as needed.   Yes [provider]  amLODipine  (NORVASC ) 5 MG tablet TAKE 1 TABLET (5 MG TOTAL) BY MOUTH DAILY. 04/27/24  Yes Abraham Abo, MD  Multiple Vitamins-Minerals (MULTIVITAMIN GUMMIES ADULT PO) Take by mouth.   Yes [provider]  Na Sulfate-K Sulfate-Mg Sulf 17.5-3.13-1.6 GM/177ML SOLN See admin instructions.   Yes [provider]  polyethylene glycol (MIRALAX ) 17 g packet Take 17 g by mouth daily as needed. 04/30/23  Yes Rogerio Clay, Amy J, NP  promethazine -dextromethorphan (PROMETHAZINE -DM) 6.25-15 MG/5ML syrup Take 5 mLs by mouth 4 (four) times daily as needed for cough. 08/28/23  Yes Lanetta Pion, NP  tirzepatide (ZEPBOUND) 2.5 MG/0.5ML injection vial Inject 2.5 mg into the skin once a week. 06/02/24  Yes Hassie Lint, PA-C  tirzepatide 5 MG/0.5ML injection vial Inject 5 mg into the skin once a week. 07/02/24  Yes Leeyah Heather M, PA-C  VITAMIN D3 1.25 MG (50000 UT) capsule Take 50,000 Units by mouth once a week.   Yes [provider]    ROS: Neg HEENT Neg resp Neg cardiac Neg GI Neg GU Neg MS Neg psych Neg neuro  Objective:   Vitals:   06/02/24 0915  BP: 134/88  Pulse: 73  Resp: 16  Temp: 98.2 F (36.8 C)  TempSrc: Oral  SpO2: 97%  Weight: 248 lb 12.8 oz (112.9 kg)  Height: 5\' 3"  (1.6 m)   Exam General appearance : Awake, alert, not in any distress. Speech Clear. Not toxic looking HEENT: Atraumatic and Normocephalic Neck: Supple, no JVD. No cervical lymphadenopathy.  Chest: Good air entry bilaterally, CTAB.  No rales/rhonchi/wheezing CVS: S1 S2 regular, no murmurs.  Neurology: Awake alert, and oriented X 3, CN II-XII intact, Non focal Skin: No Rash  Data Review No results found for: "HGBA1C"  Assessment & Plan   1. Class 3 severe obesity due to excess calories with serious comorbidity and body mass index (BMI) of 40.0 to 44.9 in adult (Primary) Stop wegovy -she has hit a plateau  - tirzepatide (ZEPBOUND) 2.5 MG/0.5ML injection vial; Inject 2.5 mg into the skin once a week.  Dispense: 0.5  mL; Refill: 0 - tirzepatide 5 MG/0.5ML injection vial; Inject 5 mg into the skin once a week.  Dispense: 0.5 mL; Refill: 1    Return in about 8 weeks (around 07/28/2024) for PCP for chronic conditions-[place]-recheck weight loss med.  The patient was given clear instructions to go to ER or return to medical center if symptoms don't improve, worsen or new problems develop. The patient verbalized understanding. The patient was told to call to get lab results if they haven't heard anything in the next week.      Dulce Gibbs, PA-C [facility] [place], [place] [phone]   06/02/2024, 9:28 AM

## 2024-06-03 ENCOUNTER — Other Ambulatory Visit: Payer: Self-pay

## 2024-06-03 NOTE — Telephone Encounter (Signed)
 PA sent to pharmacy tech to be sent

## 2024-07-23 ENCOUNTER — Other Ambulatory Visit: Payer: Self-pay | Admitting: Family Medicine

## 2024-08-06 ENCOUNTER — Telehealth: Payer: Self-pay | Admitting: Family Medicine

## 2024-08-06 ENCOUNTER — Ambulatory Visit: Admitting: Family Medicine

## 2024-08-06 NOTE — Telephone Encounter (Signed)
 Called pt and left vm to call office back to reschedule missed appt

## 2024-08-07 ENCOUNTER — Encounter: Payer: Self-pay | Admitting: Family Medicine

## 2024-08-07 ENCOUNTER — Ambulatory Visit: Admitting: Family Medicine

## 2024-08-07 VITALS — BP 125/82 | HR 63 | Ht 63.0 in | Wt 241.4 lb

## 2024-08-07 DIAGNOSIS — E6609 Other obesity due to excess calories: Secondary | ICD-10-CM

## 2024-08-07 DIAGNOSIS — E66813 Obesity, class 3: Secondary | ICD-10-CM

## 2024-08-07 DIAGNOSIS — Z7689 Persons encountering health services in other specified circumstances: Secondary | ICD-10-CM

## 2024-08-07 DIAGNOSIS — Z7985 Long-term (current) use of injectable non-insulin antidiabetic drugs: Secondary | ICD-10-CM

## 2024-08-07 DIAGNOSIS — Z6841 Body Mass Index (BMI) 40.0 and over, adult: Secondary | ICD-10-CM

## 2024-08-07 MED ORDER — TIRZEPATIDE-WEIGHT MANAGEMENT 7.5 MG/0.5ML ~~LOC~~ SOLN
7.5000 mg | SUBCUTANEOUS | 0 refills | Status: DC
Start: 2024-08-07 — End: 2024-08-25

## 2024-08-12 ENCOUNTER — Encounter: Payer: Self-pay | Admitting: Family Medicine

## 2024-08-12 NOTE — Progress Notes (Signed)
 Established Patient Office Visit  Subjective    Patient ID: Bethany Rojas, female    DOB: 09/25/1970  Age: 54 y.o. MRN: [id number]  CC:  Chief Complaint  Patient presents with   Medical Management of Chronic Issues    HPI Bethany Rojas presents for routine weight loss management. Patient denies acute complaints   Outpatient Encounter Medications as of 08/07/2024  Medication Sig   acetaminophen  (TYLENOL ) 500 MG tablet Take 500 mg by mouth every 6 (six) hours as needed.   amLODipine  (NORVASC ) 5 MG tablet TAKE 1 TABLET (5 MG TOTAL) BY MOUTH DAILY.   Multiple Vitamins-Minerals (MULTIVITAMIN GUMMIES ADULT PO) Take by mouth.   Na Sulfate-K Sulfate-Mg Sulf 17.5-3.13-1.6 GM/177ML SOLN See admin instructions.   polyethylene glycol (MIRALAX ) 17 g packet Take 17 g by mouth daily as needed.   promethazine -dextromethorphan (PROMETHAZINE -DM) 6.25-15 MG/5ML syrup Take 5 mLs by mouth 4 (four) times daily as needed for cough.   tirzepatide  7.5 MG/0.5ML injection vial Inject 7.5 mg into the skin once a week.   VITAMIN D3 1.25 MG (50000 UT) capsule Take 50,000 Units by mouth once a week.   [DISCONTINUED] tirzepatide  (ZEPBOUND ) 2.5 MG/0.5ML injection vial Inject 2.5 mg into the skin once a week.   [DISCONTINUED] tirzepatide  5 MG/0.5ML injection vial Inject 5 mg into the skin once a week.   No facility-administered encounter medications on file as of 08/07/2024.    Past Medical History:  Diagnosis Date   Advanced maternal age, primigravida in second trimester, antepartum 10/07/2014   GERD (gastroesophageal reflux disease)    Gestational hypertension 10/07/2014   Hypertension    Phreesia 05/05/2021   IUGR (intrauterine growth restriction) 10/07/2014   Medical history non-contributory    Pregnancy induced hypertension 10/07/2014    Past Surgical History:  Procedure Laterality Date   CESAREAN SECTION N/A    Phreesia 05/05/2021   NO PAST SURGERIES     TUBAL LIGATION      Family History   Problem Relation Age of Onset   Colon cancer Mother 44   Breast cancer Mother    Cancer Mother    Hypertension Mother    Esophageal cancer Neg Hx    Rectal cancer Neg Hx    Stomach cancer Neg Hx     Social History   Socioeconomic History   Marital status: Married    Spouse name: Not on file   Number of children: Not on file   Years of education: Not on file   Highest education level: Bachelor's degree (e.g., BA, AB, BS)  Occupational History   Not on file  Tobacco Use   Smoking status: Never   Smokeless tobacco: Never  Vaping Use   Vaping status: Never Used  Substance and Sexual Activity   Alcohol use: No   Drug use: No   Sexual activity: Yes    Birth control/protection: Surgical  Other Topics Concern   Not on file  Social History Narrative   Not on file   Social Drivers of Health   Financial Resource Strain: Low Risk  (04/29/2023)   Overall Financial Resource Strain (CARDIA)    Difficulty of Paying Living Expenses: Not hard at all  Food Insecurity: No Food Insecurity (04/29/2023)   Hunger Vital Sign    Worried About Running Out of Food in the Last Year: Never true    Ran Out of Food in the Last Year: Never true  Transportation Needs: No Transportation Needs (04/29/2023)   PRAPARE - Transportation  Lack of Transportation (Medical): No    Lack of Transportation (Non-Medical): No  Physical Activity: Insufficiently Active (04/29/2023)   Exercise Vital Sign    Days of Exercise per Week: 2 days    Minutes of Exercise per Session: 30 min  Stress: No Stress Concern Present (04/29/2023)   [employer] of Occupational Health - Occupational Stress Questionnaire    Feeling of Stress : Not at all  Social Connections: Moderately Integrated (04/29/2023)   Social Connection and Isolation Panel    Frequency of Communication with Friends and Family: Once a week    Frequency of Social Gatherings with Friends and Family: Once a week    Attends Religious Services: 1 to 4  times per year    Active Member of [employer] or Organizations: Yes    Attends Banker Meetings: 1 to 4 times per year    Marital Status: Married  Catering manager Violence: Not on file    Review of Systems  All other systems reviewed and are negative.       Objective    BP 125/82   Pulse 63   Ht 5' 3 (1.6 m)   Wt 241 lb 6.4 oz (109.5 kg)   SpO2 95%   BMI 42.76 kg/m   Physical Exam Vitals and nursing note reviewed.  Constitutional:      General: She is not in acute distress.    Appearance: She is obese.  Cardiovascular:     Rate and Rhythm: Normal rate and regular rhythm.  Pulmonary:     Effort: Pulmonary effort is normal.     Breath sounds: Normal breath sounds.  Abdominal:     Palpations: Abdomen is soft.     Tenderness: There is no abdominal tenderness.  Neurological:     General: No focal deficit present.     Mental Status: She is alert and oriented to person, place, and time.         Assessment & Plan:   Encounter for weight management  Class 3 severe obesity due to excess calories with serious comorbidity and body mass index (BMI) of 40.0 to 44.9 in adult  Other orders -     Tirzepatide -Weight Management; Inject 7.5 mg into the skin once a week.  Dispense: 2 mL; Refill: 0     Return in about 2 months (around 10/07/2024) for follow up, weight management.   Tanda Raguel SQUIBB, MD

## 2024-08-18 ENCOUNTER — Other Ambulatory Visit: Payer: Self-pay | Admitting: Family Medicine

## 2024-08-20 ENCOUNTER — Encounter: Payer: Self-pay | Admitting: Family Medicine

## 2024-08-21 ENCOUNTER — Other Ambulatory Visit: Payer: Self-pay | Admitting: Family Medicine

## 2024-08-21 NOTE — Telephone Encounter (Signed)
 Copied from CRM #[id number]. Topic: Clinical - Medication Refill >> Aug 21, 2024  2:10 PM Yolanda T wrote: Medication:  tirzepatide  7.5 MG/0.5ML injection vial    Has the patient contacted their pharmacy? Yes Pharmacy stated they need new script as the other one expired because the medication was out of stock for so long  This is the patient's preferred pharmacy:  [id number] [place], [place] - [address] [place]  [address] Phone: [phone] Fax: [phone]  Is this the correct pharmacy for this prescription? Yes  Has the prescription been filled recently? Yes  Is the patient out of the medication? Yes  Has the patient been seen for an appointment in the last year OR does the patient have an upcoming appointment? Yes  Can we respond through MyChart? Yes  Agent: Please be advised that Rx refills may take up to 3 business days. We ask that you follow-up with your pharmacy.

## 2024-08-24 NOTE — Telephone Encounter (Signed)
 Requested medication (s) are due for refill today - yes  Requested medication (s) are on the active medication list -yes  Future visit scheduled -yes  Last refill: 08/07/24 2ml  Notes to clinic: no protocol showing for this medication - sent for review   Requested Prescriptions  Pending Prescriptions Disp Refills   tirzepatide  7.5 MG/0.5ML injection vial 2 mL 0    Sig: Inject 7.5 mg into the skin once a week.     There is no refill protocol information for this order       Requested Prescriptions  Pending Prescriptions Disp Refills   tirzepatide  7.5 MG/0.5ML injection vial 2 mL 0    Sig: Inject 7.5 mg into the skin once a week.     There is no refill protocol information for this order

## 2024-08-25 ENCOUNTER — Encounter: Payer: Self-pay | Admitting: Family Medicine

## 2024-08-25 ENCOUNTER — Other Ambulatory Visit: Payer: Self-pay | Admitting: Family Medicine

## 2024-08-25 MED ORDER — TIRZEPATIDE-WEIGHT MANAGEMENT 7.5 MG/0.5ML ~~LOC~~ SOLN: 7.5000 mg | SUBCUTANEOUS | 0 refills | Status: DC

## 2024-08-25 NOTE — Telephone Encounter (Signed)
 Alternative Requested:THE PRESCRIBED MEDICATION IS NOT COVERED BY INSURANCE. PLEASE CONSIDER CHANGING TO ONE OF THE SUGGESTED COVERED ALTERNATIVES.

## 2024-08-25 NOTE — Telephone Encounter (Unsigned)
 Copied from CRM [id number]. Topic: Clinical - Prescription Issue >> Aug 25, 2024  8:55 AM Cleave MATSU wrote: Reason for CRM: pharmacy is asking Dr. Tanda to update the date on the prescription please

## 2024-08-27 ENCOUNTER — Other Ambulatory Visit: Payer: Self-pay | Admitting: Pharmacist

## 2024-08-27 ENCOUNTER — Other Ambulatory Visit: Payer: Self-pay

## 2024-08-27 MED ORDER — TIRZEPATIDE 7.5 MG/0.5ML ~~LOC~~ SOAJ
7.5000 mg | SUBCUTANEOUS | 1 refills | Status: DC
Start: 2024-08-27 — End: 2024-08-28

## 2024-08-28 ENCOUNTER — Telehealth: Payer: Self-pay

## 2024-08-28 ENCOUNTER — Other Ambulatory Visit: Payer: Self-pay

## 2024-08-28 ENCOUNTER — Other Ambulatory Visit: Payer: Self-pay | Admitting: Pharmacist

## 2024-08-28 MED ORDER — TIRZEPATIDE-WEIGHT MANAGEMENT 7.5 MG/0.5ML ~~LOC~~ SOAJ: 7.5000 mg | SUBCUTANEOUS | 1 refills | Status: AC

## 2024-08-28 NOTE — Telephone Encounter (Signed)
 Copied from CRM #[id number]. Topic: Clinical - Medication Question >> Aug 28, 2024  1:48 PM Sophia H wrote: Reason for CRM: Patient would like to know why Mounjaro  was sent in instead of zepbound . States she needed new prior authorization on Zepbound  because as of end of June all PA's for medications expired. Patient is confused about the mounjaro , please reach out and advise # [phone]. Patient frustrated, states she has been trying to get this taken care of all month.

## 2024-10-07 ENCOUNTER — Ambulatory Visit: Admitting: Family Medicine

## 2024-10-19 ENCOUNTER — Other Ambulatory Visit: Payer: Self-pay

## 2024-10-25 ENCOUNTER — Other Ambulatory Visit: Payer: Self-pay | Admitting: Family Medicine

## 2024-10-27 NOTE — Telephone Encounter (Signed)
 Requested by interface surescripts. Future visit 11/25/24. No refills remain. Requested Prescriptions  Pending Prescriptions Disp Refills   amLODipine  (NORVASC ) 5 MG tablet [Pharmacy Med Name: AMLODIPINE  BESYLATE 5 MG TAB] 90 tablet 0    Sig: TAKE 1 TABLET (5 MG TOTAL) BY MOUTH DAILY.     Cardiovascular: Calcium  Channel Blockers 2 Passed - 10/27/2024 11:27 AM      Passed - Last BP in normal range    BP Readings from Last 1 Encounters:  08/07/24 125/82         Passed - Last Heart Rate in normal range    Pulse Readings from Last 1 Encounters:  08/07/24 63         Passed - Valid encounter within last 6 months    Recent Outpatient Visits           2 months ago Encounter for weight management   [facility] Raguel, MD   4 months ago Class 3 severe obesity due to excess calories with serious comorbidity and body mass index (BMI) of 40.0 to 44.9 in adult   [facility], Jon HERO, PA-C   7 months ago Encounter for [employer] management   [place] [facility], MD   9 months ago Encounter for weight management   [place] [facility], MD   12 months ago Annual physical exam   [facility], MD

## 2024-11-23 ENCOUNTER — Telehealth: Payer: Self-pay

## 2024-11-23 NOTE — Telephone Encounter (Unsigned)
 Copied from CRM [id number]. Topic: Appointments - Appointment Scheduling >> Nov 23, 2024  3:53 PM Antony RAMAN wrote: Patient/patient representative is calling to schedule an appointment. Refer to attachments for appointment information.    Patient had to reschedule her 11/26 but wants to be seen sooner than jan 8th. She told me she's been able to see a nurse before but I advised I couldn't schedule her with a nurse but someone would call her back to clear things up

## 2024-11-24 NOTE — Telephone Encounter (Signed)
 Tried to call pt for clarification. No answer, LVM to call back.

## 2024-11-25 ENCOUNTER — Ambulatory Visit: Admitting: Family Medicine

## 2024-11-25 NOTE — Telephone Encounter (Signed)
 No appts available with Dr. Tanda in December. Can pt be DB? Please advise.

## 2024-11-25 NOTE — Telephone Encounter (Signed)
 Noted

## 2024-11-25 NOTE — Telephone Encounter (Signed)
 Copied from CRM #[id number]. Topic: Appointments - Appointment Info/Confirmation >> Nov 25, 2024  8:54 AM Montie POUR wrote: Patient/patient representative is calling for information regarding an appointment.  Bethany Rojas would like a sooner appointment. She currently has an appointment on 01/07/25 and would like an appointment around the first of December. Please call her at [phone] to discuss. Thanks

## 2024-12-22 ENCOUNTER — Encounter: Payer: Self-pay | Admitting: Family Medicine

## 2024-12-22 ENCOUNTER — Ambulatory Visit: Admitting: Family Medicine

## 2024-12-22 VITALS — BP 116/81 | HR 70 | Ht 63.0 in | Wt 243.2 lb

## 2024-12-22 DIAGNOSIS — Z13228 Encounter for screening for other metabolic disorders: Secondary | ICD-10-CM | POA: Diagnosis not present

## 2024-12-22 DIAGNOSIS — Z0000 Encounter for general adult medical examination without abnormal findings: Secondary | ICD-10-CM

## 2024-12-22 DIAGNOSIS — Z1329 Encounter for screening for other suspected endocrine disorder: Secondary | ICD-10-CM

## 2024-12-22 DIAGNOSIS — Z130 Encounter for screening for diseases of the blood and blood-forming organs and certain disorders involving the immune mechanism: Secondary | ICD-10-CM | POA: Diagnosis not present

## 2024-12-22 DIAGNOSIS — Z136 Encounter for screening for cardiovascular disorders: Secondary | ICD-10-CM

## 2024-12-22 NOTE — Progress Notes (Signed)
 "  Established Patient Office Visit  Subjective    Patient ID: Bethany Rojas, female    DOB: June 01, 1970  Age: 54 y.o. MRN: [id number]  CC:  Chief Complaint  Patient presents with   Medical Management of Chronic Issues    HPI Bethany Rojas presents for routine annual exam. Patient denies acute complaints.   Outpatient Encounter Medications as of 12/22/2024  Medication Sig   amLODipine  (NORVASC ) 5 MG tablet TAKE 1 TABLET (5 MG TOTAL) BY MOUTH DAILY.   MOUNJARO  7.5 MG/0.5ML Pen Inject 7.5 mg into the skin once a week.   Multiple Vitamins-Minerals (MULTIVITAMIN GUMMIES ADULT PO) Take by mouth.   tirzepatide  (ZEPBOUND ) 7.5 MG/0.5ML Pen Inject 7.5 mg into the skin once a week. (Patient not taking: Reported on 12/22/2024)   No facility-administered encounter medications on file as of 12/22/2024.    Past Medical History:  Diagnosis Date   Advanced maternal age, primigravida in second trimester, antepartum 10/07/2014   GERD (gastroesophageal reflux disease)    Gestational hypertension 10/07/2014   Hypertension    Phreesia 05/05/2021   IUGR (intrauterine growth restriction) 10/07/2014   Medical history non-contributory    Pregnancy induced hypertension 10/07/2014    Past Surgical History:  Procedure Laterality Date   CESAREAN SECTION N/A    Phreesia 05/05/2021   NO PAST SURGERIES     TUBAL LIGATION      Family History  Problem Relation Age of Onset   Colon cancer Mother 110   Breast cancer Mother    Cancer Mother    Hypertension Mother    Esophageal cancer Neg Hx    Rectal cancer Neg Hx    Stomach cancer Neg Hx     Social History   Socioeconomic History   Marital status: Married    Spouse name: Not on file   Number of children: Not on file   Years of education: Not on file   Highest education level: Bachelor's degree (e.g., BA, AB, BS)  Occupational History   Not on file  Tobacco Use   Smoking status: Never   Smokeless tobacco: Never  Vaping Use   Vaping  status: Never Used  Substance and Sexual Activity   Alcohol use: No   Drug use: No   Sexual activity: Yes    Birth control/protection: Surgical  Other Topics Concern   Not on file  Social History Narrative   Not on file   Social Drivers of Health   Tobacco Use: Low Risk (08/12/2024)   Patient History    Smoking Tobacco Use: Never    Smokeless Tobacco Use: Never    Passive Exposure: Not on file  Financial Resource Strain: Low Risk (04/29/2023)   Overall Financial Resource Strain (CARDIA)    Difficulty of Paying Living Expenses: Not hard at all  Food Insecurity: No Food Insecurity (04/29/2023)   Hunger Vital Sign    Worried About Running Out of Food in the Last Year: Never true    Ran Out of Food in the Last Year: Never true  Transportation Needs: No Transportation Needs (04/29/2023)   PRAPARE - Administrator, Civil Service (Medical): No    Lack of Transportation (Non-Medical): No  Physical Activity: Insufficiently Active (04/29/2023)   Exercise Vital Sign    Days of Exercise per Week: 2 days    Minutes of Exercise per Session: 30 min  Stress: No Stress Concern Present (04/29/2023)   [employer] of Occupational Health - Occupational Stress Questionnaire    Feeling  of Stress : Not at all  Social Connections: Moderately Integrated (04/29/2023)   Social Connection and Isolation Panel    Frequency of Communication with Friends and Family: Once a week    Frequency of Social Gatherings with Friends and Family: Once a week    Attends Religious Services: 1 to 4 times per year    Active Member of Clubs or Organizations: Yes    Attends Banker Meetings: 1 to 4 times per year    Marital Status: Married  Catering Manager Violence: Not on file  Depression (PHQ2-9): Low Risk (03/03/2024)   Depression (PHQ2-9)    PHQ-2 Score: 0  Alcohol Screen: Low Risk (04/29/2023)   Alcohol Screen    Last Alcohol Screening Score (AUDIT): 3  Housing: Low Risk (04/29/2023)    Housing    Last Housing Risk Score: 0  Utilities: Not At Risk (12/22/2024)   Epic    Threatened with loss of utilities: No  Health Literacy: Adequate Health Literacy (12/22/2024)   B1300 Health Literacy    Frequency of need for help with medical instructions: Never    Review of Systems  All other systems reviewed and are negative.       Objective    BP 116/81   Pulse 70   Ht 5' 3 (1.6 m)   Wt 243 lb 3.2 oz (110.3 kg)   LMP 04/24/2021   SpO2 96%   BMI 43.08 kg/m   Physical Exam Vitals and nursing note reviewed.  Constitutional:      General: She is not in acute distress.    Appearance: She is obese.  HENT:     Head: Normocephalic and atraumatic.     Right Ear: Tympanic membrane, ear canal and external ear normal.     Left Ear: Tympanic membrane, ear canal and external ear normal.     Nose: Nose normal.     Mouth/Throat:     Mouth: Mucous membranes are moist.     Pharynx: Oropharynx is clear.  Eyes:     Conjunctiva/sclera: Conjunctivae normal.     Pupils: Pupils are equal, round, and reactive to light.  Neck:     Thyroid : No thyromegaly.  Cardiovascular:     Rate and Rhythm: Normal rate and regular rhythm.     Heart sounds: Normal heart sounds. No murmur heard. Pulmonary:     Effort: Pulmonary effort is normal. No respiratory distress.     Breath sounds: Normal breath sounds.  Abdominal:     General: There is no distension.     Palpations: Abdomen is soft. There is no mass.     Tenderness: There is no abdominal tenderness.  Musculoskeletal:        General: Normal range of motion.     Cervical back: Normal range of motion and neck supple.  Skin:    General: Skin is warm and dry.  Neurological:     General: No focal deficit present.     Mental Status: She is alert and oriented to person, place, and time.  Psychiatric:        Mood and Affect: Mood normal.        Behavior: Behavior normal.         Assessment & Plan:   Annual physical exam -      Comprehensive metabolic panel with GFR  Screening for deficiency anemia -     CBC with Differential/Platelet  Encounter for screening for cardiovascular disorders -     Lipid panel  Screening for endocrine/metabolic/immunity disorders -     VITAMIN D  25 Hydroxy (Vit-D Deficiency, Fractures) -     Hemoglobin A1c     No follow-ups on file.   Tanda Raguel SQUIBB, MD  "

## 2024-12-23 ENCOUNTER — Ambulatory Visit: Payer: Self-pay | Admitting: Family Medicine

## 2024-12-23 LAB — COMPREHENSIVE METABOLIC PANEL WITH GFR
ALT: 17 IU/L (ref 0–32)
AST: 13 IU/L (ref 0–40)
Albumin: 4.2 g/dL (ref 3.8–4.9)
Alkaline Phosphatase: 78 IU/L (ref 49–135)
BUN/Creatinine Ratio: 14 (ref 9–23)
BUN: 12 mg/dL (ref 6–24)
Bilirubin Total: 0.3 mg/dL (ref 0.0–1.2)
CO2: 21 mmol/L (ref 20–29)
Calcium: 9.4 mg/dL (ref 8.7–10.2)
Chloride: 104 mmol/L (ref 96–106)
Creatinine, Ser: 0.86 mg/dL (ref 0.57–1.00)
Globulin, Total: 2.3 g/dL (ref 1.5–4.5)
Glucose: 100 mg/dL — ABNORMAL HIGH (ref 70–99)
Potassium: 4 mmol/L (ref 3.5–5.2)
Sodium: 138 mmol/L (ref 134–144)
Total Protein: 6.5 g/dL (ref 6.0–8.5)
eGFR: 81 mL/min/1.73

## 2024-12-23 LAB — LIPID PANEL
Chol/HDL Ratio: 3.3 ratio (ref 0.0–4.4)
Cholesterol, Total: 158 mg/dL (ref 100–199)
HDL: 48 mg/dL
LDL Chol Calc (NIH): 90 mg/dL (ref 0–99)
Triglycerides: 112 mg/dL (ref 0–149)
VLDL Cholesterol Cal: 20 mg/dL (ref 5–40)

## 2024-12-23 LAB — CBC WITH DIFFERENTIAL/PLATELET
Basophils Absolute: 0 x10E3/uL (ref 0.0–0.2)
Basos: 1 %
EOS (ABSOLUTE): 0.1 x10E3/uL (ref 0.0–0.4)
Eos: 2 %
Hematocrit: 42.9 % (ref 34.0–46.6)
Hemoglobin: 14 g/dL (ref 11.1–15.9)
Immature Grans (Abs): 0 x10E3/uL (ref 0.0–0.1)
Immature Granulocytes: 0 %
Lymphocytes Absolute: 1.6 x10E3/uL (ref 0.7–3.1)
Lymphs: 34 %
MCH: 26.6 pg (ref 26.6–33.0)
MCHC: 32.6 g/dL (ref 31.5–35.7)
MCV: 82 fL (ref 79–97)
Monocytes Absolute: 0.4 x10E3/uL (ref 0.1–0.9)
Monocytes: 9 %
Neutrophils Absolute: 2.5 x10E3/uL (ref 1.4–7.0)
Neutrophils: 54 %
Platelets: 314 x10E3/uL (ref 150–450)
RBC: 5.26 x10E6/uL (ref 3.77–5.28)
RDW: 14.9 % (ref 11.7–15.4)
WBC: 4.6 x10E3/uL (ref 3.4–10.8)

## 2024-12-23 LAB — HEMOGLOBIN A1C
Est. average glucose Bld gHb Est-mCnc: 114 mg/dL
Hgb A1c MFr Bld: 5.6 % (ref 4.8–5.6)

## 2024-12-23 LAB — VITAMIN D 25 HYDROXY (VIT D DEFICIENCY, FRACTURES): Vit D, 25-Hydroxy: 29 ng/mL — ABNORMAL LOW (ref 30.0–100.0)

## 2024-12-23 MED ORDER — VITAMIN D (ERGOCALCIFEROL) 1.25 MG (50000 UNIT) PO CAPS: 50000.0000 [IU] | ORAL_CAPSULE | ORAL | 0 refills | Status: AC

## 2025-01-07 ENCOUNTER — Ambulatory Visit: Admitting: Family Medicine

## 2025-02-03 ENCOUNTER — Other Ambulatory Visit: Payer: Self-pay
